# Patient Record
Sex: Female | Born: 1990 | Race: White | Hispanic: No | Smoking: Former smoker
Health system: Southern US, Community
[De-identification: ages and names within clinical notes are randomized; demographics above are authoritative.]

## PROBLEM LIST (undated history)

## (undated) ENCOUNTER — Inpatient Hospital Stay (HOSPITAL_COMMUNITY): Payer: Self-pay

## (undated) DIAGNOSIS — F419 Anxiety disorder, unspecified: Secondary | ICD-10-CM

## (undated) DIAGNOSIS — K219 Gastro-esophageal reflux disease without esophagitis: Secondary | ICD-10-CM

## (undated) DIAGNOSIS — K7689 Other specified diseases of liver: Secondary | ICD-10-CM

## (undated) DIAGNOSIS — Z789 Other specified health status: Secondary | ICD-10-CM

## (undated) DIAGNOSIS — E785 Hyperlipidemia, unspecified: Secondary | ICD-10-CM

## (undated) DIAGNOSIS — K829 Disease of gallbladder, unspecified: Secondary | ICD-10-CM

## (undated) DIAGNOSIS — G4700 Insomnia, unspecified: Secondary | ICD-10-CM

## (undated) DIAGNOSIS — E282 Polycystic ovarian syndrome: Secondary | ICD-10-CM

## (undated) DIAGNOSIS — G935 Compression of brain: Secondary | ICD-10-CM

## (undated) DIAGNOSIS — F410 Panic disorder [episodic paroxysmal anxiety] without agoraphobia: Secondary | ICD-10-CM

## (undated) HISTORY — DX: Compression of brain: G93.5

## (undated) HISTORY — DX: Insomnia, unspecified: G47.00

## (undated) HISTORY — DX: Anxiety disorder, unspecified: F41.9

## (undated) HISTORY — DX: Polycystic ovarian syndrome: E28.2

## (undated) HISTORY — DX: Other specified diseases of liver: K76.89

## (undated) HISTORY — DX: Hyperlipidemia, unspecified: E78.5

## (undated) HISTORY — DX: Gastro-esophageal reflux disease without esophagitis: K21.9

---

## 2012-04-19 ENCOUNTER — Emergency Department (HOSPITAL_COMMUNITY)
Admission: EM | Admit: 2012-04-19 | Discharge: 2012-04-19 | Disposition: A | Discharge status: Home / Self Care | Payer: Managed Care, Other (non HMO) | Attending: Emergency Medicine | Admitting: Emergency Medicine

## 2012-04-19 ENCOUNTER — Encounter (HOSPITAL_COMMUNITY): Payer: Self-pay | Admitting: Emergency Medicine

## 2012-04-19 ENCOUNTER — Emergency Department (HOSPITAL_COMMUNITY): Payer: Managed Care, Other (non HMO)

## 2012-04-19 DIAGNOSIS — R1011 Right upper quadrant pain: Secondary | ICD-10-CM | POA: Insufficient documentation

## 2012-04-19 DIAGNOSIS — R109 Unspecified abdominal pain: Secondary | ICD-10-CM

## 2012-04-19 DIAGNOSIS — Z87891 Personal history of nicotine dependence: Secondary | ICD-10-CM | POA: Insufficient documentation

## 2012-04-19 LAB — PREGNANCY, URINE: Preg Test, Ur: NEGATIVE

## 2012-04-19 LAB — URINALYSIS, ROUTINE W REFLEX MICROSCOPIC
Bilirubin Urine: NEGATIVE
Nitrite: NEGATIVE
Specific Gravity, Urine: 1.011 (ref 1.005–1.030)
Urobilinogen, UA: 0.2 mg/dL (ref 0.0–1.0)
pH: 5.5 (ref 5.0–8.0)

## 2012-04-19 LAB — COMPREHENSIVE METABOLIC PANEL
ALT: 11 U/L (ref 0–35)
Albumin: 3.6 g/dL (ref 3.5–5.2)
Alkaline Phosphatase: 26 U/L — ABNORMAL LOW (ref 39–117)
Chloride: 103 mEq/L (ref 96–112)
Glucose, Bld: 98 mg/dL (ref 70–99)
Potassium: 3.7 mEq/L (ref 3.5–5.1)
Sodium: 136 mEq/L (ref 135–145)
Total Bilirubin: 0.4 mg/dL (ref 0.3–1.2)
Total Protein: 6.7 g/dL (ref 6.0–8.3)

## 2012-04-19 LAB — CBC WITH DIFFERENTIAL/PLATELET
Basophils Relative: 0 % (ref 0–1)
Eosinophils Absolute: 0.2 10*3/uL (ref 0.0–0.7)
Hemoglobin: 13 g/dL (ref 12.0–15.0)
Lymphs Abs: 2.7 10*3/uL (ref 0.7–4.0)
MCH: 29.6 pg (ref 26.0–34.0)
Neutro Abs: 5.3 10*3/uL (ref 1.7–7.7)
Neutrophils Relative %: 60 % (ref 43–77)
Platelets: 303 10*3/uL (ref 150–400)
RBC: 4.39 MIL/uL (ref 3.87–5.11)

## 2012-04-19 MED ORDER — OXYCODONE-ACETAMINOPHEN 5-325 MG PO TABS: 1.0000 | ORAL_TABLET | ORAL | Status: AC | PRN

## 2012-04-19 MED ORDER — ONDANSETRON HCL 4 MG PO TABS: 4.0000 mg | ORAL_TABLET | Freq: Four times a day (QID) | ORAL | Status: AC

## 2012-04-19 MED ORDER — SODIUM CHLORIDE 0.9 % IV BOLUS (SEPSIS)
1000.0000 mL | Freq: Once | INTRAVENOUS | Status: AC
  Administered 2012-04-19: 1000 mL via INTRAVENOUS

## 2012-04-19 MED ORDER — ONDANSETRON HCL 4 MG/2ML IJ SOLN
4.0000 mg | Freq: Once | INTRAMUSCULAR | Status: AC
  Administered 2012-04-19: 4 mg via INTRAVENOUS
  Filled 2012-04-19: qty 2

## 2012-04-19 MED ORDER — HYDROMORPHONE HCL PF 1 MG/ML IJ SOLN
1.0000 mg | Freq: Once | INTRAMUSCULAR | Status: AC
  Administered 2012-04-19: 1 mg via INTRAVENOUS
  Filled 2012-04-19: qty 1

## 2012-04-19 MED ORDER — RANITIDINE HCL 150 MG PO TABS: 150.0000 mg | ORAL_TABLET | Freq: Two times a day (BID) | ORAL | Status: DC

## 2012-04-19 NOTE — ED Provider Notes (Signed)
History     CSN: [id number]  Arrival date & time 04/19/12  1756   First MD Initiated Contact with Patient 04/19/12 1843      Chief Complaint  Patient presents with  . Abdominal Pain    (Consider location/radiation/quality/duration/timing/severity/associated sxs/prior treatment) Patient is a 21 y.o. female presenting with abdominal pain. The history is provided by the patient and the spouse. No language interpreter was used.  Abdominal Pain The primary symptoms of the illness include abdominal pain, fever, nausea and vomiting. The primary symptoms of the illness do not include shortness of breath, diarrhea or vaginal discharge. The current episode started more than 2 days ago. The onset of the illness was gradual. The problem has been gradually worsening.  Associated with: eating. Symptoms associated with the illness do not include chills, heartburn, frequency or back pain.   21 year old female coming in with complaint of right upper quadrant abdominal pain nausea and vomiting. States that the pain started about a week ago and she has been vomiting yellow fluid. States that she has had a temp as well next was 101 yesterday. States that the pain and vomiting is worse after eating. No abdominal surgeries in the past. Denies vaginal discharge or bleeding. She does take birth control pills. Former smoker.   History reviewed. No pertinent past medical history.  Past Surgical History  Procedure Date  . Tonsillectomy     No family history on file.  History  Substance Use Topics  . Smoking status: Former Games developer  . Smokeless tobacco: Never Used  . Alcohol Use: 1.8 oz/week    3 Cans of beer per week    OB History    Grav Para Term Preterm Abortions TAB SAB Ect Mult Living                  Review of Systems  Constitutional: Positive for fever. Negative for chills.  HENT: Negative.   Eyes: Negative.   Respiratory: Negative.  Negative for shortness of breath.   Cardiovascular:  Negative.   Gastrointestinal: Positive for nausea, vomiting and abdominal pain. Negative for heartburn and diarrhea.  Genitourinary: Negative for frequency and vaginal discharge.  Musculoskeletal: Negative for back pain.  Neurological: Negative.   Psychiatric/Behavioral: Negative.   All other systems reviewed and are negative.    Allergies  Mold extract  Home Medications   Current Outpatient Rx  Name Route Sig Dispense Refill  . CETIRIZINE HCL 5 MG PO TABS Oral Take 5 mg by mouth daily.    Marland Kitchen ETHYNODIOL DIAC-ETH ESTRADIOL 1-50 MG-MCG PO TABS Oral Take 1 tablet by mouth daily.    Marland Kitchen ZOLPIDEM TARTRATE 5 MG PO TABS Oral Take 5 mg by mouth at bedtime as needed.      BP 127/75  Pulse 103  Temp 98.5 F (36.9 C) (Oral)  Resp 18  Ht 5\' 5"  (1.651 m)  Wt 200 lb (90.719 kg)  BMI 33.28 kg/m2  SpO2 100%  LMP 04/06/2012  Physical Exam  Nursing note and vitals reviewed. Constitutional: She is oriented to person, place, and time. She appears well-developed and well-nourished.  HENT:  Head: Normocephalic and atraumatic.  Eyes: Conjunctivae normal and EOM are normal. Pupils are equal, round, and reactive to light.  Neck: Normal range of motion. Neck supple.  Cardiovascular: Normal rate, regular rhythm, normal heart sounds and intact distal pulses.  Exam reveals no gallop and no friction rub.   No murmur heard. Pulmonary/Chest: Effort normal and breath sounds normal.  Abdominal: Soft.  Bowel sounds are normal.       RUQ tenderness  Musculoskeletal: Normal range of motion. She exhibits no edema and no tenderness.  Neurological: She is alert and oriented to person, place, and time. She has normal reflexes.  Skin: Skin is warm and dry.  Psychiatric: She has a normal mood and affect.    ED Course  Procedures (including critical care time)   Labs Reviewed  URINALYSIS, ROUTINE W REFLEX MICROSCOPIC  CBC WITH DIFFERENTIAL  COMPREHENSIVE METABOLIC PANEL  LIPASE, BLOOD   No results  found.   No diagnosis found.    MDM  21yo female with RUQ pain,subjective fever and vomiting x 1 week.  U/s negative for gallbladder dz.  Labs unremarkable. U/a normal.  GI follow up.  rx for zofran, percocet and zantac.  No further vomiting in the ER.  Pain 2/10 after pain meds x 1.  NS bolus also given.     Labs Reviewed  URINALYSIS, ROUTINE W REFLEX MICROSCOPIC - Abnormal; Notable for the following:    APPearance CLOUDY (*)     Leukocytes, UA SMALL (*)     All other components within normal limits  COMPREHENSIVE METABOLIC PANEL - Abnormal; Notable for the following:    Alkaline Phosphatase 26 (*)     All other components within normal limits  CBC WITH DIFFERENTIAL  LIPASE, BLOOD  URINE MICROSCOPIC-ADD ON  PREGNANCY, URINE  LAB REPORT - SCANNED           Remi Haggard, NP 04/21/12 1626

## 2012-04-19 NOTE — ED Notes (Signed)
Pt. on ultrasound at bedside.

## 2012-04-19 NOTE — ED Notes (Signed)
Pt indicates she has had abdominal pain, RUQ, fever, emesis for one week. Went to [facility] and was advised to come here for [place] for possible gallbladder disease. Pt was not given copies of any documents. Has been eating normally this week, escalates pain and emesis following most meals.

## 2012-04-23 NOTE — ED Provider Notes (Signed)
Medical screening examination/treatment/procedure(s) were performed by non-physician practitioner and as supervising physician I was immediately available for consultation/collaboration.   Rosie Golson, MD 04/23/12 1841 

## 2012-09-08 ENCOUNTER — Other Ambulatory Visit (HOSPITAL_COMMUNITY)
Admission: RE | Admit: 2012-09-08 | Discharge: 2012-09-08 | Disposition: A | Discharge status: Home / Self Care | Payer: BC Managed Care – PPO | Source: Ambulatory Visit | Attending: Obstetrics and Gynecology | Admitting: Obstetrics and Gynecology

## 2012-09-08 DIAGNOSIS — Z01419 Encounter for gynecological examination (general) (routine) without abnormal findings: Secondary | ICD-10-CM | POA: Insufficient documentation

## 2012-09-08 DIAGNOSIS — N760 Acute vaginitis: Secondary | ICD-10-CM | POA: Insufficient documentation

## 2012-09-10 ENCOUNTER — Other Ambulatory Visit: Payer: Self-pay

## 2012-09-10 DIAGNOSIS — IMO0002 Reserved for concepts with insufficient information to code with codable children: Secondary | ICD-10-CM

## 2012-09-13 ENCOUNTER — Ambulatory Visit
Admission: RE | Admit: 2012-09-13 | Discharge: 2012-09-13 | Disposition: A | Discharge status: Home / Self Care | Payer: BC Managed Care – PPO | Source: Ambulatory Visit

## 2012-09-13 DIAGNOSIS — IMO0002 Reserved for concepts with insufficient information to code with codable children: Secondary | ICD-10-CM

## 2013-02-11 ENCOUNTER — Emergency Department (HOSPITAL_COMMUNITY): Payer: BC Managed Care – PPO

## 2013-02-11 ENCOUNTER — Encounter (HOSPITAL_COMMUNITY): Payer: Self-pay | Admitting: *Deleted

## 2013-02-11 ENCOUNTER — Emergency Department (HOSPITAL_COMMUNITY)
Admission: EM | Admit: 2013-02-11 | Discharge: 2013-02-11 | Disposition: A | Discharge status: Home / Self Care | Payer: BC Managed Care – PPO | Attending: Emergency Medicine | Admitting: Emergency Medicine

## 2013-02-11 DIAGNOSIS — R197 Diarrhea, unspecified: Secondary | ICD-10-CM | POA: Insufficient documentation

## 2013-02-11 DIAGNOSIS — R630 Anorexia: Secondary | ICD-10-CM | POA: Insufficient documentation

## 2013-02-11 DIAGNOSIS — Z3202 Encounter for pregnancy test, result negative: Secondary | ICD-10-CM | POA: Insufficient documentation

## 2013-02-11 DIAGNOSIS — R110 Nausea: Secondary | ICD-10-CM | POA: Insufficient documentation

## 2013-02-11 DIAGNOSIS — K2970 Gastritis, unspecified, without bleeding: Secondary | ICD-10-CM | POA: Insufficient documentation

## 2013-02-11 DIAGNOSIS — Z79899 Other long term (current) drug therapy: Secondary | ICD-10-CM | POA: Insufficient documentation

## 2013-02-11 DIAGNOSIS — R109 Unspecified abdominal pain: Secondary | ICD-10-CM | POA: Insufficient documentation

## 2013-02-11 DIAGNOSIS — Z87891 Personal history of nicotine dependence: Secondary | ICD-10-CM | POA: Insufficient documentation

## 2013-02-11 LAB — URINALYSIS, ROUTINE W REFLEX MICROSCOPIC
Glucose, UA: NEGATIVE mg/dL
Ketones, ur: NEGATIVE mg/dL
Leukocytes, UA: NEGATIVE
Protein, ur: NEGATIVE mg/dL

## 2013-02-11 LAB — CBC WITH DIFFERENTIAL/PLATELET
Basophils Absolute: 0 10*3/uL (ref 0.0–0.1)
HCT: 37.6 % (ref 36.0–46.0)
Hemoglobin: 12.9 g/dL (ref 12.0–15.0)
Lymphocytes Relative: 24 % (ref 12–46)
Lymphs Abs: 1.8 10*3/uL (ref 0.7–4.0)
Monocytes Absolute: 0.6 10*3/uL (ref 0.1–1.0)
Monocytes Relative: 8 % (ref 3–12)
Neutro Abs: 4.7 10*3/uL (ref 1.7–7.7)
WBC: 7.2 10*3/uL (ref 4.0–10.5)

## 2013-02-11 LAB — COMPREHENSIVE METABOLIC PANEL
AST: 12 U/L (ref 0–37)
BUN: 10 mg/dL (ref 6–23)
CO2: 25 mEq/L (ref 19–32)
Chloride: 104 mEq/L (ref 96–112)
Creatinine, Ser: 0.61 mg/dL (ref 0.50–1.10)
GFR calc non Af Amer: >90 mL/min (ref >90)
Glucose, Bld: 91 mg/dL (ref 70–99)
Total Bilirubin: 0.6 mg/dL (ref 0.3–1.2)

## 2013-02-11 MED ORDER — FAMOTIDINE 20 MG PO TABS: 20.0000 mg | ORAL_TABLET | Freq: Two times a day (BID) | ORAL | Status: DC

## 2013-02-11 MED ORDER — HYDROCODONE-ACETAMINOPHEN 5-325 MG PO TABS: 1.0000 | ORAL_TABLET | ORAL | Status: DC | PRN

## 2013-02-11 MED ORDER — HYDROMORPHONE HCL PF 1 MG/ML IJ SOLN
1.0000 mg | Freq: Once | INTRAMUSCULAR | Status: DC
  Filled 2013-02-11: qty 1

## 2013-02-11 MED ORDER — IOHEXOL 300 MG/ML  SOLN
100.0000 mL | Freq: Once | INTRAMUSCULAR | Status: AC | PRN
  Administered 2013-02-11: 100 mL via INTRAVENOUS

## 2013-02-11 MED ORDER — ONDANSETRON 8 MG PO TBDP
8.0000 mg | ORAL_TABLET | Freq: Once | ORAL | Status: AC
  Administered 2013-02-11: 8 mg via ORAL
  Filled 2013-02-11: qty 1

## 2013-02-11 MED ORDER — IOHEXOL 300 MG/ML  SOLN
50.0000 mL | Freq: Once | INTRAMUSCULAR | Status: AC | PRN
  Administered 2013-02-11: 50 mL via ORAL

## 2013-02-11 MED ORDER — ONDANSETRON HCL 4 MG/2ML IJ SOLN
4.0000 mg | Freq: Once | INTRAMUSCULAR | Status: AC
  Administered 2013-02-11: 4 mg via INTRAVENOUS
  Filled 2013-02-11: qty 2

## 2013-02-11 MED ORDER — HYDROMORPHONE HCL PF 1 MG/ML IJ SOLN
1.0000 mg | Freq: Once | INTRAMUSCULAR | Status: AC
  Administered 2013-02-11: 1 mg via INTRAVENOUS
  Filled 2013-02-11: qty 1

## 2013-02-11 MED ORDER — PANTOPRAZOLE SODIUM 40 MG IV SOLR
40.0000 mg | Freq: Once | INTRAVENOUS | Status: AC
  Administered 2013-02-11: 40 mg via INTRAVENOUS
  Filled 2013-02-11: qty 40

## 2013-02-11 MED ORDER — HYDROMORPHONE HCL PF 1 MG/ML IJ SOLN
1.0000 mg | Freq: Once | INTRAMUSCULAR | Status: AC
  Administered 2013-02-11: 1 mg via INTRAVENOUS
  Filled 2013-02-11 (×2): qty 1

## 2013-02-11 MED ORDER — HYDROMORPHONE HCL PF 1 MG/ML IJ SOLN
1.0000 mg | Freq: Once | INTRAMUSCULAR | Status: AC
  Administered 2013-02-11: 1 mg via INTRAMUSCULAR

## 2013-02-11 NOTE — ED Notes (Signed)
Patient was educated not to drive, operate heavy machinery, or drink alcohol while taking narcotic medication.  

## 2013-02-11 NOTE — Progress Notes (Signed)
Patient reports Dr. Farris Has of [facility] is his pcp.

## 2013-02-11 NOTE — ED Provider Notes (Signed)
History    CSN: [id number] Arrival date & time 02/11/13  1154  First MD Initiated Contact with Patient 02/11/13 1301     Chief Complaint  Patient presents with  . Abdominal Pain   (Consider location/radiation/quality/duration/timing/severity/associated sxs/prior Treatment) HPI Comments: 22 year old female with no significant past medical history presents emergency department complaining of sudden onset midepigastric and left upper quadrant abdominal pain beginning last night after eating 2 hamburgers from [employer]. States the pain is constant with episodes of severe intensity, nonradiating. Admits to associated nausea without vomiting. She has not had any appetite today and did not eat anything today. She has not tried any alleviating factors for her pain. No history of abdominal surgery. Admits to associated non-bloody diarrhea. Denies fever, chills, vaginal discharge or bleeding or urinary changes.  Patient is a 22 y.o. female presenting with abdominal pain. The history is provided by the patient.  Abdominal Pain Associated symptoms include abdominal pain and nausea. Pertinent negatives include no chest pain, chills, fever or vomiting.   History reviewed. No pertinent past medical history. Past Surgical History  Procedure Laterality Date  . Tonsillectomy     History reviewed. No pertinent family history. History  Substance Use Topics  . Smoking status: Former Games developer  . Smokeless tobacco: Never Used  . Alcohol Use: 1.8 oz/week    3 Cans of beer per week   OB History   Grav Para Term Preterm Abortions TAB SAB Ect Mult Living                 Review of Systems  Constitutional: Positive for appetite change. Negative for fever and chills.  Respiratory: Negative for shortness of breath.   Cardiovascular: Negative for chest pain.  Gastrointestinal: Positive for nausea, abdominal pain and diarrhea. Negative for vomiting and constipation.  Genitourinary: Negative for flank pain,  vaginal bleeding, vaginal discharge, vaginal pain, menstrual problem and pelvic pain.  All other systems reviewed and are negative.    Allergies  Erythromycin; Oxycontin; Vicodin; and Mold extract  Home Medications   Current Outpatient Rx  Name  Route  Sig  Dispense  Refill  . cetirizine (ZYRTEC) 5 MG tablet   Oral   Take 5 mg by mouth daily.         . Norethindrone-Ethinyl Estradiol-Fe Biphas (LO LOESTRIN FE) 1 MG-10 MCG / 10 MCG tablet   Oral   Take 1 tablet by mouth daily.         Marland Kitchen zolpidem (AMBIEN) 5 MG tablet   Oral   Take 10 mg by mouth at bedtime as needed.           BP 148/85  Pulse 115  Temp(Src) 98.6 F (37 C)  Resp 18  SpO2 100%  LMP 01/26/2013 Physical Exam  Nursing note and vitals reviewed. Constitutional: She is oriented to person, place, and time. She appears well-developed. No distress.  Obese, tearful.  HENT:  Head: Normocephalic and atraumatic.  Mouth/Throat: Oropharynx is clear and moist.  Eyes: Conjunctivae are normal. No scleral icterus.  Neck: Normal range of motion. Neck supple.  Cardiovascular: Regular rhythm and normal heart sounds.  Tachycardia present.   Pulmonary/Chest: Effort normal and breath sounds normal. No respiratory distress.  Abdominal: Soft. Normal appearance. She exhibits no distension and no mass. There is tenderness in the epigastric area and left upper quadrant. There is no rigidity, no rebound and no guarding.  Musculoskeletal: Normal range of motion. She exhibits no edema.  Neurological: She is alert and oriented  to person, place, and time.  Skin: Skin is warm and dry. She is not diaphoretic.  Psychiatric: She has a normal mood and affect. Her behavior is normal.    ED Course  Procedures (including critical care time) Labs Reviewed  COMPREHENSIVE METABOLIC PANEL - Abnormal; Notable for the following:    Alkaline Phosphatase 34 (*)    All other components within normal limits  URINALYSIS, ROUTINE W REFLEX  MICROSCOPIC - Abnormal; Notable for the following:    APPearance CLOUDY (*)    All other components within normal limits  CBC WITH DIFFERENTIAL  LIPASE, BLOOD  POCT PREGNANCY, URINE   Ct Abdomen Pelvis W Contrast  02/11/2013   *RADIOLOGY REPORT*  Clinical Data: Left upper quadrant abdominal pain  CT ABDOMEN AND PELVIS WITH CONTRAST  Technique:  Multidetector CT imaging of the abdomen and pelvis was performed following the standard protocol during bolus administration of intravenous contrast.  Contrast: OMNIPAQUE IOHEXOL 300 MG/ML  SOLN  Comparison: Abdominal ultrasound 04/19/2012  Findings:  Lower Chest:  The lung bases are clear.  The visualized cardiac structures are within normal limits for size.  Trace pericardial fluid versus thickening.  Unremarkable distal thoracic esophagus.  Abdomen: Unremarkable CT appearance of the stomach, duodenum, spleen, adrenal glands and pancreas.  Normal hepatic contours and morphology.  No focal hepatic lesion. Gallbladder is unremarkable. No intra or extrahepatic biliary ductal dilatation.  Normal parenchymal enhancement of the kidneys bilaterally.  No hydronephrosis, nephrolithiasis or enhancing renal mass.  Normal-caliber large and small bowel throughout the abdomen.  No evidence of obstruction.  Normal appendix in the right lower quadrant.  Unremarkable terminal ileum.  No free fluid or suspicious adenopathy.  Pelvis: Unremarkable bladder, uterus and adnexa.  Trace free fluid in the pelvis is likely physiologic for a reproductive age female.  Bones: No acute fracture or aggressive appearing lytic or blastic osseous lesion.  Vascular: No acute vascular abnormality.  IMPRESSION: Essentially unremarkable CT scan of the abdomen and pelvis.  No acute abnormality to explain the patient's clinical symptoms.  Trace free fluid in the pelvis is likely physiologic in a reproductive age female.   Original Report Authenticated By: Malachy Moan, M.D.   1. Gastritis   2.  Abdominal pain     MDM  Patient with mid-epigastric/LUQ abdominal pain beginning after eating [employer] last night. Nausea, no vomiting. Probable gastritis. Labs obtained in triage prior to patient being seen- cbc, cmp, lipase, ua, preg. Dilaudid for pain, zofran for nausea, protonix. 2:41 PM Patient reports worsening abdominal pain. Left sided, radiating to back. No nausea. Epigastric/LUQ very tender on exam. Will give another dose of dilaudid, obtain CT scan abdomen/pelvis. 3:54 PM CT scan unremarkable. Patient reports marked improvement of pain at this time. Abdomen soft with mild tenderness. Her vitals are stable and she is stable for discharge. Discharged with pepcid and vicodin. She will f/u with her PCP. Conservative measures discussed. Return precautions discussed. Patient states understanding of plan and is agreeable.   Trevor Mace, PA-C 02/11/13 1555

## 2013-02-11 NOTE — ED Notes (Signed)
Pt c/o severe abd pain to left upper quadrant that started suddenly last night. Denies vomiting reports nausea.

## 2013-02-12 NOTE — ED Provider Notes (Signed)
Medical screening examination/treatment/procedure(s) were performed by non-physician practitioner and as supervising physician I was immediately available for consultation/collaboration.  Derwood Kaplan, MD 02/12/13 [phone]

## 2013-06-05 LAB — OB RESULTS CONSOLE ABO/RH: RH TYPE: POSITIVE

## 2013-06-05 LAB — OB RESULTS CONSOLE HIV ANTIBODY (ROUTINE TESTING): HIV: NONREACTIVE

## 2013-06-25 LAB — OB RESULTS CONSOLE GC/CHLAMYDIA
CHLAMYDIA, DNA PROBE: NEGATIVE
Gonorrhea: NEGATIVE

## 2013-08-06 HISTORY — PX: TONSILLECTOMY: SUR1361

## 2013-08-06 NOTE — L&D Delivery Note (Signed)
Delivery Note  Pt began pushing about 1900, FHR remained reassuring   At 7:32 PM a viable female was delivered via Vaginal, Spontaneous Delivery (Presentation: Right Occiput Anterior).  APGAR: 8,8 ; weight: pending    Placenta status: Intact, Spontaneous Pathology.  Cord: 3 vessels with the following complications: .  Cord pH: n/a   Anesthesia: Epidural  Episiotomy: None Lacerations: 2nd degree;Periurethral Suture Repair: 3.0 vicryl 4-0 monocryl Est. Blood Loss (mL): 350  Mom to postpartum.  Baby to Couplet care / Skin to Skin. Pt plans to BF Pt desires inpatient circumcision  Pt plans OCP for contraception   LILLARD,SHELLEY M 01/30/2014, 8:03 PM

## 2013-11-23 ENCOUNTER — Other Ambulatory Visit: Payer: Self-pay

## 2013-11-26 ENCOUNTER — Other Ambulatory Visit: Payer: Self-pay | Admitting: Obstetrics and Gynecology

## 2013-11-27 ENCOUNTER — Ambulatory Visit (HOSPITAL_COMMUNITY): Admission: RE | Admit: 2013-11-27 | Payer: BC Managed Care – PPO | Source: Ambulatory Visit

## 2013-11-27 ENCOUNTER — Ambulatory Visit (HOSPITAL_COMMUNITY)
Admission: RE | Admit: 2013-11-27 | Discharge: 2013-11-27 | Disposition: A | Discharge status: Home / Self Care | Payer: BC Managed Care – PPO | Source: Ambulatory Visit | Attending: Obstetrics and Gynecology | Admitting: Obstetrics and Gynecology

## 2013-11-27 ENCOUNTER — Encounter (HOSPITAL_COMMUNITY): Payer: Self-pay

## 2013-11-27 ENCOUNTER — Other Ambulatory Visit: Payer: Self-pay | Admitting: Obstetrics and Gynecology

## 2013-11-27 DIAGNOSIS — Z3689 Encounter for other specified antenatal screening: Secondary | ICD-10-CM | POA: Insufficient documentation

## 2013-11-27 DIAGNOSIS — O365990 Maternal care for other known or suspected poor fetal growth, unspecified trimester, not applicable or unspecified: Secondary | ICD-10-CM | POA: Insufficient documentation

## 2013-12-01 ENCOUNTER — Other Ambulatory Visit: Payer: Self-pay | Admitting: Obstetrics and Gynecology

## 2013-12-01 DIAGNOSIS — O365990 Maternal care for other known or suspected poor fetal growth, unspecified trimester, not applicable or unspecified: Secondary | ICD-10-CM

## 2013-12-02 ENCOUNTER — Inpatient Hospital Stay (HOSPITAL_COMMUNITY)
Admission: AD | Admit: 2013-12-02 | Discharge: 2013-12-03 | Disposition: A | Discharge status: Home / Self Care | Payer: BC Managed Care – PPO | Source: Ambulatory Visit | Attending: Obstetrics and Gynecology | Admitting: Obstetrics and Gynecology

## 2013-12-02 ENCOUNTER — Encounter (HOSPITAL_COMMUNITY): Payer: Self-pay | Admitting: *Deleted

## 2013-12-02 DIAGNOSIS — Z87891 Personal history of nicotine dependence: Secondary | ICD-10-CM | POA: Insufficient documentation

## 2013-12-02 DIAGNOSIS — Z8249 Family history of ischemic heart disease and other diseases of the circulatory system: Secondary | ICD-10-CM | POA: Insufficient documentation

## 2013-12-02 DIAGNOSIS — O365990 Maternal care for other known or suspected poor fetal growth, unspecified trimester, not applicable or unspecified: Secondary | ICD-10-CM | POA: Insufficient documentation

## 2013-12-02 DIAGNOSIS — O368190 Decreased fetal movements, unspecified trimester, not applicable or unspecified: Secondary | ICD-10-CM | POA: Insufficient documentation

## 2013-12-02 DIAGNOSIS — O368130 Decreased fetal movements, third trimester, not applicable or unspecified: Secondary | ICD-10-CM

## 2013-12-02 NOTE — MAU Note (Signed)
Pt states she last "felt" the baby move about 2100.

## 2013-12-02 NOTE — MAU Provider Note (Signed)
Chief Complaint:  Decreased Fetal Movement  First Provider Initiated Contact with Patient 12/02/13 2354    HPI: Cheryl [name] is a 23 y.o. G1P0000 at 5358w5d who presents to maternity admissions reporting decreased FM tonight.  Denies contractions, leakage of fluid or vaginal bleeding. Good fetal movement.   Pregnancy Course: IUGR  Past Medical History: History reviewed. No pertinent past medical history.  Past obstetric history: OB History  Gravida Para Term Preterm AB SAB TAB Ectopic Multiple Living  1 0 0 0 0 0 0 0 0 0     # Outcome Date GA Lbr Len/2nd Weight Sex Delivery Anes PTL Lv  1 CUR               Past Surgical History: Past Surgical History  Procedure Laterality Date  . Tonsillectomy       Family History: Family History  Problem Relation Age of Onset  . Hypertension Mother   . Depression Mother   . Parkinson's disease Mother   . Diabetes Father   . Hypertension Father   . Diabetes Maternal Grandmother   . Cancer Maternal Grandmother   . Hyperlipidemia Maternal Grandmother   . Hyperlipidemia Paternal Grandmother   . Diabetes Paternal Grandmother   . Parkinson's disease Paternal Grandmother   . Diabetes Paternal Grandfather   . Hyperlipidemia Paternal Grandfather     Social History: History  Substance Use Topics  . Smoking status: Former Games developermoker  . Smokeless tobacco: Never Used  . Alcohol Use: None    Allergies:  Allergies  Allergen Reactions  . Erythromycin Itching  . Oxycontin [Oxycodone Hcl] Itching  . Vicodin [Hydrocodone-Acetaminophen] Itching  . Mold Extract [Trichophyton] Rash    Meds:  Prescriptions prior to admission  Medication Sig Dispense Refill  . cetirizine (ZYRTEC) 5 MG tablet Take 5 mg by mouth daily.      . famotidine (PEPCID) 20 MG tablet Take 1 tablet (20 mg total) by mouth 2 (two) times daily.  30 tablet  0  . HYDROcodone-acetaminophen (NORCO/VICODIN) 5-325 MG per tablet Take 1-2 tablets by mouth every 4 (four) hours as  needed for pain.  10 tablet  0  . Norethindrone-Ethinyl Estradiol-Fe Biphas (LO LOESTRIN FE) 1 MG-10 MCG / 10 MCG tablet Take 1 tablet by mouth daily.      Marland Kitchen. zolpidem (AMBIEN) 5 MG tablet Take 10 mg by mouth at bedtime as needed.         ROS: Pertinent findings in history of present illness.  Physical Exam  Blood pressure 122/68, pulse 100, temperature 98.3 F (36.8 C), temperature source Oral, resp. rate 18, height 5\' 4"  (1.626 m), weight 101.606 kg (224 lb), last menstrual period 05/01/2013, SpO2 100.00%. GENERAL: Well-developed, well-nourished female in no acute distress.  HEENT: normocephalic HEART: normal rate RESP: normal effort ABDOMEN: Soft, non-tender, gravid appropriate for gestational age EXTREMITIES: Nontender, no edema NEURO: alert and oriented  FHT:  Baseline 140 , moderate variability, accelerations present, no decelerations Contractions: None   Labs: No results found for this or any previous visit (from the past 24 hour(s)).  Imaging:  NA  MAU Course: Feeling baby move well while in MAU.   Assessment: 1. Decreased fetal movements in third trimester    Plan: Discharge home Preterm labor precautions and fetal kick counts.  Follow-up Information   Follow up with Geryl RankinsVARNADO, [name], MD On 12/08/2013. (As scheduled or sooner, As needed, If symptoms worsen)    Specialty:  Obstetrics and Gynecology   Contact information:   [phone]  [address] [place] [address] [phone]       [facility] with [facility]. (As needed in emergencies)    Contact information:   [id number] [place] [address] [phone]       Medication List    STOP taking these medications       famotidine 20 MG tablet  Commonly known as:  PEPCID     HYDROcodone-acetaminophen 5-325 MG per tablet  Commonly known as:  NORCO/VICODIN     LO LOESTRIN FE 1 MG-10 MCG / 10 MCG tablet  Generic drug:   Norethindrone-Ethinyl Estradiol-Fe Biphas     zolpidem 5 MG tablet  Commonly known as:  AMBIEN      TAKE these medications       cetirizine 5 MG tablet  Commonly known as:  ZYRTEC  Take 5 mg by mouth daily.       [place] Sophi Calligan, CNM 12/02/2013 11:55 PM

## 2013-12-02 NOTE — MAU Note (Signed)
Decreased fetal movement this pm

## 2013-12-03 DIAGNOSIS — O368190 Decreased fetal movements, unspecified trimester, not applicable or unspecified: Secondary | ICD-10-CM

## 2013-12-03 NOTE — Discharge Instructions (Signed)

## 2013-12-04 ENCOUNTER — Other Ambulatory Visit: Payer: Self-pay

## 2013-12-04 ENCOUNTER — Ambulatory Visit (HOSPITAL_COMMUNITY)
Admission: RE | Admit: 2013-12-04 | Discharge: 2013-12-04 | Disposition: A | Discharge status: Home / Self Care | Payer: BC Managed Care – PPO | Source: Ambulatory Visit | Attending: Obstetrics and Gynecology | Admitting: Obstetrics and Gynecology

## 2013-12-04 ENCOUNTER — Encounter (HOSPITAL_COMMUNITY): Payer: Self-pay | Admitting: *Deleted

## 2013-12-04 ENCOUNTER — Inpatient Hospital Stay (HOSPITAL_COMMUNITY)
Admission: AD | Admit: 2013-12-04 | Discharge: 2013-12-04 | Disposition: A | Discharge status: Home / Self Care | Payer: BC Managed Care – PPO | Source: Ambulatory Visit | Attending: Obstetrics and Gynecology | Admitting: Obstetrics and Gynecology

## 2013-12-04 DIAGNOSIS — O99891 Other specified diseases and conditions complicating pregnancy: Secondary | ICD-10-CM

## 2013-12-04 DIAGNOSIS — Z3689 Encounter for other specified antenatal screening: Secondary | ICD-10-CM | POA: Insufficient documentation

## 2013-12-04 DIAGNOSIS — R000 Tachycardia, unspecified: Secondary | ICD-10-CM

## 2013-12-04 DIAGNOSIS — O9989 Other specified diseases and conditions complicating pregnancy, childbirth and the puerperium: Principal | ICD-10-CM

## 2013-12-04 DIAGNOSIS — O365990 Maternal care for other known or suspected poor fetal growth, unspecified trimester, not applicable or unspecified: Secondary | ICD-10-CM | POA: Insufficient documentation

## 2013-12-04 DIAGNOSIS — Z8249 Family history of ischemic heart disease and other diseases of the circulatory system: Secondary | ICD-10-CM | POA: Insufficient documentation

## 2013-12-04 DIAGNOSIS — Z87891 Personal history of nicotine dependence: Secondary | ICD-10-CM | POA: Insufficient documentation

## 2013-12-04 HISTORY — DX: Other specified health status: Z78.9

## 2013-12-04 LAB — CBC
HEMATOCRIT: 36.4 % (ref 36.0–46.0)
HEMOGLOBIN: 12.3 g/dL (ref 12.0–15.0)
MCH: 29.8 pg (ref 26.0–34.0)
MCHC: 33.8 g/dL (ref 30.0–36.0)
MCV: 88.1 fL (ref 78.0–100.0)
Platelets: 183 10*3/uL (ref 150–400)
RBC: 4.13 MIL/uL (ref 3.87–5.11)
RDW: 13.7 % (ref 11.5–15.5)
WBC: 11.8 10*3/uL — ABNORMAL HIGH (ref 4.0–10.5)

## 2013-12-04 LAB — URINALYSIS, ROUTINE W REFLEX MICROSCOPIC
Bilirubin Urine: NEGATIVE
GLUCOSE, UA: NEGATIVE mg/dL
HGB URINE DIPSTICK: NEGATIVE
KETONES UR: NEGATIVE mg/dL
Leukocytes, UA: NEGATIVE
Nitrite: NEGATIVE
PROTEIN: NEGATIVE mg/dL
Specific Gravity, Urine: 1.01 (ref 1.005–1.030)
UROBILINOGEN UA: 0.2 mg/dL (ref 0.0–1.0)
pH: 7 (ref 5.0–8.0)

## 2013-12-04 LAB — COMPREHENSIVE METABOLIC PANEL
ALK PHOS: 59 U/L (ref 39–117)
ALT: 11 U/L (ref 0–35)
AST: 11 U/L (ref 0–37)
Albumin: 2.9 g/dL — ABNORMAL LOW (ref 3.5–5.2)
BUN: 7 mg/dL (ref 6–23)
CALCIUM: 8.7 mg/dL (ref 8.4–10.5)
CO2: 22 meq/L (ref 19–32)
Chloride: 103 mEq/L (ref 96–112)
Creatinine, Ser: 0.47 mg/dL — ABNORMAL LOW (ref 0.50–1.10)
GLUCOSE: 85 mg/dL (ref 70–99)
Potassium: 4.1 mEq/L (ref 3.7–5.3)
Sodium: 138 mEq/L (ref 137–147)
TOTAL PROTEIN: 6.4 g/dL (ref 6.0–8.3)
Total Bilirubin: 0.2 mg/dL — ABNORMAL LOW (ref 0.3–1.2)

## 2013-12-04 LAB — TSH: TSH: 1.58 u[IU]/mL (ref 0.350–4.500)

## 2013-12-04 LAB — T4, FREE: Free T4: 1.1 ng/dL (ref 0.80–1.80)

## 2013-12-04 NOTE — MAU Provider Note (Signed)
History     CSN: [id number]  Arrival date and time: 12/04/13 1123   First Provider Initiated Contact with Patient 12/04/13 1243      No chief complaint on file.  HPI  Cheryl [name] is a 23 y.o. G1P0000 at 8355w0d who presents today from MFM with increased heartrate. She has been followed by MFM for decreased fetal growth, and was there for doppler studies today. While there it was noted that she was tachycardic. She states that her grandmother passed away this morning, and she has been upset about that. However, she states that she does not feel anxious. She states that she has not had any episodes similar to this in the past, and denies any cardiac HX or medications.   Past Medical History  Diagnosis Date  . Medical history non-contributory     Past Surgical History  Procedure Laterality Date  . Tonsillectomy      Family History  Problem Relation Age of Onset  . Hypertension Mother   . Depression Mother   . Parkinson's disease Mother   . Diabetes Father   . Hypertension Father   . Diabetes Maternal Grandmother   . Cancer Maternal Grandmother   . Hyperlipidemia Maternal Grandmother   . Hyperlipidemia Paternal Grandmother   . Diabetes Paternal Grandmother   . Parkinson's disease Paternal Grandmother   . Diabetes Paternal Grandfather   . Hyperlipidemia Paternal Grandfather     History  Substance Use Topics  . Smoking status: Former Games developermoker  . Smokeless tobacco: Never Used  . Alcohol Use: No    Allergies:  Allergies  Allergen Reactions  . Erythromycin Itching  . Oxycontin [Oxycodone Hcl] Itching  . Vicodin [Hydrocodone-Acetaminophen] Itching  . Mold Extract [Trichophyton] Rash    Prescriptions prior to admission  Medication Sig Dispense Refill  . acetaminophen (TYLENOL) 325 MG tablet Take 325 mg by mouth every 6 (six) hours as needed for headache.      . calcium carbonate (TUMS - DOSED IN MG ELEMENTAL CALCIUM) 500 MG chewable tablet Chew 2 tablets by mouth  daily as needed for indigestion or heartburn.      . cetirizine (ZYRTEC) 5 MG tablet Take 5 mg by mouth daily.      . Doxylamine-Pyridoxine 10-10 MG TBEC Take 2 tablets by mouth at bedtime.        ROS Physical Exam   Blood pressure 123/70, pulse 128, temperature 97.7 F (36.5 C), temperature source Oral, resp. rate 18, last menstrual period 05/01/2013, SpO2 98.00%.  Physical Exam  Nursing note and vitals reviewed. Constitutional: She is oriented to person, place, and time. She appears well-developed and well-nourished. No distress.  Cardiovascular: Normal rate.   Respiratory: Effort normal.  GI: Soft.  Neurological: She is alert and oriented to person, place, and time.  Skin: Skin is warm and dry.  Psychiatric: She has a normal mood and affect.   FHT: 150, moderate with 15x15 accels, no decels Toco: No UCs  MAU Course  Procedures  Results for orders placed during the hospital encounter of 12/04/13 (from the past 24 hour(s))  CBC     Status: Abnormal   Collection Time    12/04/13 12:18 PM      Result Value Ref Range   WBC 11.8 (*) 4.0 - 10.5 K/uL   RBC 4.13  3.87 - 5.11 MIL/uL   Hemoglobin 12.3  12.0 - 15.0 g/dL   HCT 40.936.4  81.136.0 - 91.446.0 %   MCV 88.1  78.0 - 100.0  fL   MCH 29.8  26.0 - 34.0 pg   MCHC 33.8  30.0 - 36.0 g/dL   RDW 16.113.7  09.611.5 - 04.515.5 %   Platelets 183  150 - 400 K/uL  COMPREHENSIVE METABOLIC PANEL     Status: Abnormal   Collection Time    12/04/13 12:18 PM      Result Value Ref Range   Sodium 138  137 - 147 mEq/L   Potassium 4.1  3.7 - 5.3 mEq/L   Chloride 103  96 - 112 mEq/L   CO2 22  19 - 32 mEq/L   Glucose, Bld 85  70 - 99 mg/dL   BUN 7  6 - 23 mg/dL   Creatinine, Ser 4.090.47 (*) 0.50 - 1.10 mg/dL   Calcium 8.7  8.4 - 81.110.5 mg/dL   Total Protein 6.4  6.0 - 8.3 g/dL   Albumin 2.9 (*) 3.5 - 5.2 g/dL   AST 11  0 - 37 U/L   ALT 11  0 - 35 U/L   Alkaline Phosphatase 59  39 - 117 U/L   Total Bilirubin 0.2 (*) 0.3 - 1.2 mg/dL   GFR calc non Af Amer >90  >90  mL/min   GFR calc Af Amer >90  >90 mL/min  URINALYSIS, ROUTINE W REFLEX MICROSCOPIC     Status: None   Collection Time    12/04/13  1:15 PM      Result Value Ref Range   Color, Urine YELLOW  YELLOW   APPearance CLEAR  CLEAR   Specific Gravity, Urine 1.010  1.005 - 1.030   pH 7.0  5.0 - 8.0   Glucose, UA NEGATIVE  NEGATIVE mg/dL   Hgb urine dipstick NEGATIVE  NEGATIVE   Bilirubin Urine NEGATIVE  NEGATIVE   Ketones, ur NEGATIVE  NEGATIVE mg/dL   Protein, ur NEGATIVE  NEGATIVE mg/dL   Urobilinogen, UA 0.2  0.0 - 1.0 mg/dL   Nitrite NEGATIVE  NEGATIVE   Leukocytes, UA NEGATIVE  NEGATIVE     1320: Dr. Dion BodyVarnado on the unit to see the patient. Reviewed labs/pending labs. She would like a UA done. If dehydrated will give LR bolus. If not, encourage increased PO hydration.   Assessment and Plan   1. Tachycardia    Third trimester precautions reviewed Fetal kick counts Return to MAU as needed  Follow-up Information   Follow up with Geryl RankinsVARNADO, [name], MD. (As scheduled)    Specialty:  Obstetrics and Gynecology   Contact information:   [address], [address] [place] [address] [phone]        Tawnya CrookHeather Donovan Hogan 12/04/2013, 12:56 PM

## 2013-12-04 NOTE — Discharge Instructions (Signed)
Third Trimester of Pregnancy  The third trimester is from week 29 through week 42, months 7 through 9. The third trimester is a time when the fetus is growing rapidly. At the end of the ninth month, the fetus is about 20 inches in length and weighs 6 10 pounds.   BODY CHANGES  Your body goes through many changes during pregnancy. The changes vary from woman to woman.    Your weight will continue to increase. You can expect to gain 25 35 pounds (11 16 kg) by the end of the pregnancy.   You may begin to get stretch marks on your hips, abdomen, and breasts.   You may urinate more often because the fetus is moving lower into your pelvis and pressing on your bladder.   You may develop or continue to have heartburn as a result of your pregnancy.   You may develop constipation because certain hormones are causing the muscles that push waste through your intestines to slow down.   You may develop hemorrhoids or swollen, bulging veins (varicose veins).   You may have pelvic pain because of the weight gain and pregnancy hormones relaxing your joints between the bones in your pelvis. Back aches may result from over exertion of the muscles supporting your posture.   Your breasts will continue to grow and be tender. A yellow discharge may leak from your breasts called colostrum.   Your belly button may stick out.   You may feel short of breath because of your expanding uterus.   You may notice the fetus "dropping," or moving lower in your abdomen.   You may have a bloody mucus discharge. This usually occurs a few days to a week before labor begins.   Your cervix becomes thin and soft (effaced) near your due date.  WHAT TO EXPECT AT YOUR PRENATAL EXAMS   You will have prenatal exams every 2 weeks until week 36. Then, you will have weekly prenatal exams. During a routine prenatal visit:   You will be weighed to make sure you and the fetus are growing normally.   Your blood pressure is taken.   Your abdomen will be  measured to track your baby's growth.   The fetal heartbeat will be listened to.   Any test results from the previous visit will be discussed.   You may have a cervical check near your due date to see if you have effaced.  At around 36 weeks, your caregiver will check your cervix. At the same time, your caregiver will also perform a test on the secretions of the vaginal tissue. This test is to determine if a type of bacteria, Group B streptococcus, is present. Your caregiver will explain this further.  Your caregiver may ask you:   What your birth plan is.   How you are feeling.   If you are feeling the baby move.   If you have had any abnormal symptoms, such as leaking fluid, bleeding, severe headaches, or abdominal cramping.   If you have any questions.  Other tests or screenings that may be performed during your third trimester include:   Blood tests that check for low iron levels (anemia).   Fetal testing to check the health, activity level, and growth of the fetus. Testing is done if you have certain medical conditions or if there are problems during the pregnancy.  FALSE LABOR  You may feel small, irregular contractions that eventually go away. These are called Braxton Hicks contractions, or   false labor. Contractions may last for hours, days, or even weeks before true labor sets in. If contractions come at regular intervals, intensify, or become painful, it is best to be seen by your caregiver.   SIGNS OF LABOR    Menstrual-like cramps.   Contractions that are 5 minutes apart or less.   Contractions that start on the top of the uterus and spread down to the lower abdomen and back.   A sense of increased pelvic pressure or back pain.   A watery or bloody mucus discharge that comes from the vagina.  If you have any of these signs before the 37th week of pregnancy, call your caregiver right away. You need to go to the hospital to get checked immediately.  HOME CARE INSTRUCTIONS    Avoid all  smoking, herbs, alcohol, and unprescribed drugs. These chemicals affect the formation and growth of the baby.   Follow your caregiver's instructions regarding medicine use. There are medicines that are either safe or unsafe to take during pregnancy.   Exercise only as directed by your caregiver. Experiencing uterine cramps is a good sign to stop exercising.   Continue to eat regular, healthy meals.   Wear a good support bra for breast tenderness.   Do not use hot tubs, steam rooms, or saunas.   Wear your seat belt at all times when driving.   Avoid raw meat, uncooked cheese, cat litter boxes, and soil used by cats. These carry germs that can cause birth defects in the baby.   Take your prenatal vitamins.   Try taking a stool softener (if your caregiver approves) if you develop constipation. Eat more high-fiber foods, such as fresh vegetables or fruit and whole grains. Drink plenty of fluids to keep your urine clear or pale yellow.   Take warm sitz baths to soothe any pain or discomfort caused by hemorrhoids. Use hemorrhoid cream if your caregiver approves.   If you develop varicose veins, wear support hose. Elevate your feet for 15 minutes, 3 4 times a day. Limit salt in your diet.   Avoid heavy lifting, wear low heal shoes, and practice good posture.   Rest a lot with your legs elevated if you have leg cramps or low back pain.   Visit your dentist if you have not gone during your pregnancy. Use a soft toothbrush to brush your teeth and be gentle when you floss.   A sexual relationship may be continued unless your caregiver directs you otherwise.   Do not travel far distances unless it is absolutely necessary and only with the approval of your caregiver.   Take prenatal classes to understand, practice, and ask questions about the labor and delivery.   Make a trial run to the hospital.   Pack your hospital bag.   Prepare the baby's nursery.   Continue to go to all your prenatal visits as directed  by your caregiver.  SEEK MEDICAL CARE IF:   You are unsure if you are in labor or if your water has broken.   You have dizziness.   You have mild pelvic cramps, pelvic pressure, or nagging pain in your abdominal area.   You have persistent nausea, vomiting, or diarrhea.   You have a bad smelling vaginal discharge.   You have pain with urination.  SEEK IMMEDIATE MEDICAL CARE IF:    You have a fever.   You are leaking fluid from your vagina.   You have spotting or bleeding from your vagina.     You have severe abdominal cramping or pain.   You have rapid weight loss or gain.   You have shortness of breath with chest pain.   You notice sudden or extreme swelling of your face, hands, ankles, feet, or legs.   You have not felt your baby move in over an hour.   You have severe headaches that do not go away with medicine.   You have vision changes.  Document Released: 07/17/2001 Document Revised: 03/25/2013 Document Reviewed: 09/23/2012  [employer] Patient Information 2014 [employer].

## 2013-12-04 NOTE — MAU Note (Signed)
[facility].  Pt's Grandmother passed away this am.  She was Tachycardia in the 140's during her visit in [facility].  Pt denies any discomfort and states, "I feel calm".  No vaginal bleeding or ROM.  Good fetal movement.

## 2013-12-04 NOTE — ED Notes (Signed)
Report called to J. Spurlock-Frizzel RN regarding pts tachycardia.  Pt to MAU.

## 2013-12-09 ENCOUNTER — Other Ambulatory Visit: Payer: Self-pay | Admitting: Obstetrics and Gynecology

## 2013-12-09 DIAGNOSIS — O365990 Maternal care for other known or suspected poor fetal growth, unspecified trimester, not applicable or unspecified: Secondary | ICD-10-CM

## 2013-12-09 NOTE — MAU Provider Note (Signed)
Pt seen. Appears to be in no acute distress. Labs and EKG reviewed which are normal. Fetal tracing Cat 1. Tachycardia trended down in MAU. Discharge home.

## 2013-12-11 ENCOUNTER — Ambulatory Visit (HOSPITAL_COMMUNITY)
Admission: RE | Admit: 2013-12-11 | Discharge: 2013-12-11 | Disposition: A | Discharge status: Home / Self Care | Payer: BC Managed Care – PPO | Source: Ambulatory Visit | Attending: Obstetrics and Gynecology | Admitting: Obstetrics and Gynecology

## 2013-12-11 VITALS — BP 134/81 | HR 123 | Wt 228.0 lb

## 2013-12-11 DIAGNOSIS — O365990 Maternal care for other known or suspected poor fetal growth, unspecified trimester, not applicable or unspecified: Secondary | ICD-10-CM | POA: Insufficient documentation

## 2013-12-11 DIAGNOSIS — Z3689 Encounter for other specified antenatal screening: Secondary | ICD-10-CM | POA: Insufficient documentation

## 2013-12-18 ENCOUNTER — Encounter (HOSPITAL_COMMUNITY): Payer: Self-pay

## 2013-12-18 ENCOUNTER — Inpatient Hospital Stay (HOSPITAL_COMMUNITY)
Admission: AD | Admit: 2013-12-18 | Discharge: 2013-12-18 | Disposition: A | Discharge status: Home / Self Care | Payer: BC Managed Care – PPO | Source: Ambulatory Visit | Attending: Obstetrics and Gynecology | Admitting: Obstetrics and Gynecology

## 2013-12-18 ENCOUNTER — Encounter (HOSPITAL_COMMUNITY): Payer: Self-pay | Admitting: *Deleted

## 2013-12-18 ENCOUNTER — Ambulatory Visit (HOSPITAL_COMMUNITY)
Admission: RE | Admit: 2013-12-18 | Discharge: 2013-12-18 | Disposition: A | Discharge status: Home / Self Care | Payer: BC Managed Care – PPO | Source: Ambulatory Visit | Attending: Obstetrics and Gynecology | Admitting: Obstetrics and Gynecology

## 2013-12-18 ENCOUNTER — Inpatient Hospital Stay (HOSPITAL_COMMUNITY): Payer: BC Managed Care – PPO

## 2013-12-18 DIAGNOSIS — O365990 Maternal care for other known or suspected poor fetal growth, unspecified trimester, not applicable or unspecified: Secondary | ICD-10-CM

## 2013-12-18 DIAGNOSIS — Z8249 Family history of ischemic heart disease and other diseases of the circulatory system: Secondary | ICD-10-CM | POA: Insufficient documentation

## 2013-12-18 DIAGNOSIS — O99891 Other specified diseases and conditions complicating pregnancy: Secondary | ICD-10-CM | POA: Insufficient documentation

## 2013-12-18 DIAGNOSIS — R000 Tachycardia, unspecified: Secondary | ICD-10-CM | POA: Insufficient documentation

## 2013-12-18 DIAGNOSIS — O9989 Other specified diseases and conditions complicating pregnancy, childbirth and the puerperium: Principal | ICD-10-CM

## 2013-12-18 DIAGNOSIS — Z87891 Personal history of nicotine dependence: Secondary | ICD-10-CM | POA: Insufficient documentation

## 2013-12-18 DIAGNOSIS — Z3689 Encounter for other specified antenatal screening: Secondary | ICD-10-CM | POA: Insufficient documentation

## 2013-12-18 LAB — URINALYSIS, ROUTINE W REFLEX MICROSCOPIC
Bilirubin Urine: NEGATIVE
Glucose, UA: NEGATIVE mg/dL
Hgb urine dipstick: NEGATIVE
Ketones, ur: NEGATIVE mg/dL
Leukocytes, UA: NEGATIVE
NITRITE: NEGATIVE
Protein, ur: NEGATIVE mg/dL
Urobilinogen, UA: 0.2 mg/dL (ref 0.0–1.0)
pH: 6 (ref 5.0–8.0)

## 2013-12-18 MED ORDER — IOHEXOL 350 MG/ML SOLN
100.0000 mL | Freq: Once | INTRAVENOUS | Status: AC | PRN
  Administered 2013-12-18: 100 mL via INTRAVENOUS

## 2013-12-18 NOTE — MAU Provider Note (Signed)
History     CSN: [id number]  Arrival date and time: 12/18/13 1108   First Provider Initiated Contact with Patient 12/18/13 1206      Chief Complaint  Patient presents with  . Tachycardia   HPI  Ms. Cheryl [name] is a 23 y.o. female G1P0000 at [redacted]w[redacted]d who presents with Tachycardia. The patient was at [facility] for an appointment and was sent here for further evaluation of tachycardia; the patients HR was in the 140's in [facility]. The patient had similar complaints 2 weeks ago and had an EKG that showed sinus tachycardia. The patient does not complain of any other associated symptoms; denies SOB, dizziness, weakness or chest pain. The patient does not feel her heart beating fast, she has never seen a cardiologist. She reports good fetal movement.   OB History   Grav Para Term Preterm Abortions TAB SAB Ect Mult Living   1 0 0 0 0 0 0 0 0 0       Past Medical History  Diagnosis Date  . Medical history non-contributory     Past Surgical History  Procedure Laterality Date  . Tonsillectomy      Family History  Problem Relation Age of Onset  . Hypertension Mother   . Depression Mother   . Parkinson's disease Mother   . Diabetes Father   . Hypertension Father   . Diabetes Maternal Grandmother   . Cancer Maternal Grandmother   . Hyperlipidemia Maternal Grandmother   . Hyperlipidemia Paternal Grandmother   . Diabetes Paternal Grandmother   . Parkinson's disease Paternal Grandmother   . Diabetes Paternal Grandfather   . Hyperlipidemia Paternal Grandfather     History  Substance Use Topics  . Smoking status: Former Games developer  . Smokeless tobacco: Never Used  . Alcohol Use: No    Allergies:  Allergies  Allergen Reactions  . Erythromycin Itching  . Oxycontin [Oxycodone Hcl] Itching  . Vicodin [Hydrocodone-Acetaminophen] Itching  . Mold Extract [Trichophyton] Rash    Prescriptions prior to admission  Medication Sig Dispense Refill  . acetaminophen (TYLENOL) 325 MG tablet  Take 325 mg by mouth every 6 (six) hours as needed for headache.      . calcium carbonate (TUMS - DOSED IN MG ELEMENTAL CALCIUM) 500 MG chewable tablet Chew 2 tablets by mouth daily as needed for indigestion or heartburn.      . cetirizine (ZYRTEC) 5 MG tablet Take 5 mg by mouth daily.      . Doxylamine-Pyridoxine 10-10 MG TBEC Take 2 tablets by mouth at bedtime.      . Prenatal Vit-Fe Fumarate-FA (PRENATAL MULTIVITAMIN) TABS tablet Take 1 tablet by mouth daily at 12 noon.       Results for orders placed during the hospital encounter of 12/18/13 (from the past 48 hour(s))  URINALYSIS, ROUTINE W REFLEX MICROSCOPIC     Status: Abnormal   Collection Time    12/18/13 11:29 AM      Result Value Ref Range   Color, Urine YELLOW  YELLOW   APPearance CLEAR  CLEAR   Specific Gravity, Urine <1.005 (*) 1.005 - 1.030   pH 6.0  5.0 - 8.0   Glucose, UA NEGATIVE  NEGATIVE mg/dL   Hgb urine dipstick NEGATIVE  NEGATIVE   Bilirubin Urine NEGATIVE  NEGATIVE   Ketones, ur NEGATIVE  NEGATIVE mg/dL   Protein, ur NEGATIVE  NEGATIVE mg/dL   Urobilinogen, UA 0.2  0.0 - 1.0 mg/dL   Nitrite NEGATIVE  NEGATIVE   Leukocytes, UA  NEGATIVE  NEGATIVE   Comment: MICROSCOPIC NOT DONE ON URINES WITH NEGATIVE PROTEIN, BLOOD, LEUKOCYTES, NITRITE, OR GLUCOSE <1000 mg/dL.   Ct Angio Chest W/cm &/or Wo Cm  12/18/2013   CLINICAL DATA:  Chest pain and tachycardia ; third trimester gestation  EXAM: CT ANGIOGRAPHY CHEST WITH CONTRAST  TECHNIQUE: Multidetector CT imaging of the chest was performed using the standard protocol during bolus administration of intravenous contrast. Multiplanar CT image reconstructions and MIPs were obtained to evaluate the vascular anatomy.  CONTRAST:  100mL OMNIPAQUE IOHEXOL 350 MG/ML SOLN  COMPARISON:  None.  FINDINGS: A portion of the lung bases is not visualized in order to minimize radiation dose due to the pregnancy.  There is no demonstrable pulmonary embolus. There is no appreciable thoracic aortic  aneurysm or dissection.  The visualized lungs are clear except for an area of mild atelectatic change in the posterior aspect of the superior segment of the left lower lobe. There is no appreciable thoracic adenopathy. There is residual thymic tissue, within normal limits in this age group. Pericardium is not thickened.  In the visualized upper abdomen, there is fatty change in the liver. Visualized upper abdominal structures, limited in this case, otherwise appear normal. There are no blastic or lytic bone lesions.  Review of the MIP images confirms the above findings.  IMPRESSION: No demonstrable pulmonary embolus. Mild left lower lobe atelectatic change. Fatty liver.   Electronically Signed   By: Bretta BangWilliam  Woodruff M.D.   On: 12/18/2013 14:42   [place] Fetal Bpp W/o Non Stress  12/18/2013   OBSTETRICAL ULTRASOUND: This exam was performed within a [facility]. The OB US report was generated in the AS system, and faxed to the ordering physician.   This report is also available in [facility] [employer] and in the [employer] [employer] PACS. See AS Obstetric US report.  [place] Ua Cord Doppler  12/18/2013   OBSTETRICAL ULTRASOUND: This exam was performed within a [facility]. The OB US report was generated in the AS system, and faxed to the ordering physician.   This report is also available in [facility] [employer] and in the [facility] [employer] PACS. See AS Obstetric US report.  Review of Systems  Constitutional: Negative for fever and chills.  Respiratory: Negative for shortness of breath.   Cardiovascular: Negative for chest pain.  Gastrointestinal: Negative for heartburn, nausea, vomiting and abdominal pain.  Neurological: Negative for dizziness, weakness and headaches.   Physical Exam   Blood pressure 126/80, pulse 106, temperature 98.4 F (36.9 C), last menstrual period 05/01/2013, SpO2 98.00%.  Physical Exam  Constitutional: She is oriented to person, place,  and time. She appears well-developed and well-nourished.  Non-toxic appearance. She does not have a sickly appearance. She does not appear ill. No distress.  HENT:  Head: Normocephalic.  Eyes: Pupils are equal, round, and reactive to light.  Neck: Normal range of motion. Neck supple.  Cardiovascular: Regular rhythm.  Tachycardia present.   Respiratory: Effort normal and breath sounds normal. No respiratory distress. She has no wheezes. She has no rales. She exhibits no tenderness.  Musculoskeletal: Normal range of motion.  Neurological: She is alert and oriented to person, place, and time.  Skin: Skin is warm. She is not diaphoretic.  Psychiatric: Her behavior is normal.   Fetal Tracing: Baseline: 140 bpm  Variability: Moderate  Accelerations: 15x15 Decelerations: None Toco: No contractions noted    MAU Course  Procedures None  MDM Dr. Dion BodyVarnado called and spoke to RN;  Spiral CT ordered per Dr. Dion BodyVarnado Medical screen complete by NP  RN Called Dr. Dion BodyVarnado with CT results. Pt discharged home Dr. Dion BodyVarnado at bedside to see patient.   Assessment and Plan   A:  Tachycardia in pregnancy; third trimester PE ruled out by spiral CT scan.   P: Discharge home per Dr. Dion BodyVarnado Follow up as scheduled.   Cheryl HansenJennifer Irene Chancie Lampert, NP  12/18/2013, 3:05 PM

## 2013-12-18 NOTE — MAU Note (Signed)
Pt sent over from [facility] after her NST. Pt HR up in the 140's. Denies pain or SOB and stated she does not feel like her heart is racing.

## 2013-12-23 ENCOUNTER — Other Ambulatory Visit: Payer: Self-pay | Admitting: Obstetrics and Gynecology

## 2013-12-23 ENCOUNTER — Other Ambulatory Visit (HOSPITAL_COMMUNITY): Payer: BC Managed Care – PPO

## 2013-12-23 ENCOUNTER — Encounter (HOSPITAL_COMMUNITY): Payer: Self-pay

## 2013-12-23 ENCOUNTER — Ambulatory Visit (HOSPITAL_COMMUNITY)
Admission: RE | Admit: 2013-12-23 | Discharge: 2013-12-23 | Disposition: A | Discharge status: Home / Self Care | Payer: BC Managed Care – PPO | Source: Ambulatory Visit | Attending: Obstetrics and Gynecology | Admitting: Obstetrics and Gynecology

## 2013-12-23 VITALS — BP 128/74 | HR 96 | Wt 225.2 lb

## 2013-12-23 DIAGNOSIS — Z3689 Encounter for other specified antenatal screening: Secondary | ICD-10-CM | POA: Insufficient documentation

## 2013-12-23 DIAGNOSIS — O365990 Maternal care for other known or suspected poor fetal growth, unspecified trimester, not applicable or unspecified: Secondary | ICD-10-CM

## 2013-12-25 ENCOUNTER — Other Ambulatory Visit (HOSPITAL_COMMUNITY): Payer: BC Managed Care – PPO

## 2013-12-30 NOTE — MAU Provider Note (Signed)
Pt was seen and evaluated. Grandmother just passed but she feels fine emotionally. PE unremarkable (CV, Lungs Abd, Ext) HR nl, EKG nl sinus tachycardia. Await CT results. A/P: Tachycardia likely due to Anxiety. R/o PE.

## 2014-01-01 ENCOUNTER — Ambulatory Visit (HOSPITAL_COMMUNITY)
Admission: RE | Admit: 2014-01-01 | Discharge: 2014-01-01 | Disposition: A | Discharge status: Home / Self Care | Payer: BC Managed Care – PPO | Source: Ambulatory Visit | Attending: Obstetrics and Gynecology | Admitting: Obstetrics and Gynecology

## 2014-01-01 ENCOUNTER — Encounter (HOSPITAL_COMMUNITY): Payer: Self-pay

## 2014-01-01 DIAGNOSIS — Z3689 Encounter for other specified antenatal screening: Secondary | ICD-10-CM | POA: Insufficient documentation

## 2014-01-01 DIAGNOSIS — O365990 Maternal care for other known or suspected poor fetal growth, unspecified trimester, not applicable or unspecified: Secondary | ICD-10-CM | POA: Insufficient documentation

## 2014-01-08 ENCOUNTER — Ambulatory Visit (HOSPITAL_COMMUNITY)
Admission: RE | Admit: 2014-01-08 | Discharge: 2014-01-08 | Disposition: A | Discharge status: Home / Self Care | Payer: BC Managed Care – PPO | Source: Ambulatory Visit | Attending: Obstetrics and Gynecology | Admitting: Obstetrics and Gynecology

## 2014-01-08 ENCOUNTER — Encounter (HOSPITAL_COMMUNITY): Payer: Self-pay

## 2014-01-08 DIAGNOSIS — Z3689 Encounter for other specified antenatal screening: Secondary | ICD-10-CM | POA: Insufficient documentation

## 2014-01-08 DIAGNOSIS — O365990 Maternal care for other known or suspected poor fetal growth, unspecified trimester, not applicable or unspecified: Secondary | ICD-10-CM | POA: Insufficient documentation

## 2014-01-15 ENCOUNTER — Ambulatory Visit (HOSPITAL_COMMUNITY)
Admission: RE | Admit: 2014-01-15 | Discharge: 2014-01-15 | Disposition: A | Discharge status: Home / Self Care | Payer: BC Managed Care – PPO | Source: Ambulatory Visit | Attending: Obstetrics and Gynecology | Admitting: Obstetrics and Gynecology

## 2014-01-15 ENCOUNTER — Encounter (HOSPITAL_COMMUNITY): Payer: Self-pay

## 2014-01-15 DIAGNOSIS — Z3689 Encounter for other specified antenatal screening: Secondary | ICD-10-CM | POA: Insufficient documentation

## 2014-01-15 DIAGNOSIS — O365990 Maternal care for other known or suspected poor fetal growth, unspecified trimester, not applicable or unspecified: Secondary | ICD-10-CM | POA: Insufficient documentation

## 2014-01-18 ENCOUNTER — Other Ambulatory Visit: Payer: Self-pay | Admitting: Obstetrics and Gynecology

## 2014-01-18 DIAGNOSIS — O365990 Maternal care for other known or suspected poor fetal growth, unspecified trimester, not applicable or unspecified: Secondary | ICD-10-CM

## 2014-01-18 LAB — OB RESULTS CONSOLE GBS: GBS: NEGATIVE

## 2014-01-22 ENCOUNTER — Ambulatory Visit (HOSPITAL_COMMUNITY)
Admission: RE | Admit: 2014-01-22 | Discharge: 2014-01-22 | Disposition: A | Discharge status: Home / Self Care | Payer: BC Managed Care – PPO | Source: Ambulatory Visit | Attending: Obstetrics and Gynecology | Admitting: Obstetrics and Gynecology

## 2014-01-22 ENCOUNTER — Encounter (HOSPITAL_COMMUNITY): Payer: Self-pay

## 2014-01-22 VITALS — BP 138/87 | HR 135 | Wt 231.0 lb

## 2014-01-22 DIAGNOSIS — O365931 Maternal care for other known or suspected poor fetal growth, third trimester, fetus 1: Secondary | ICD-10-CM

## 2014-01-22 DIAGNOSIS — O365990 Maternal care for other known or suspected poor fetal growth, unspecified trimester, not applicable or unspecified: Secondary | ICD-10-CM

## 2014-01-22 DIAGNOSIS — Z3689 Encounter for other specified antenatal screening: Secondary | ICD-10-CM | POA: Insufficient documentation

## 2014-01-27 ENCOUNTER — Telehealth (HOSPITAL_COMMUNITY): Payer: Self-pay | Admitting: *Deleted

## 2014-01-27 ENCOUNTER — Other Ambulatory Visit: Payer: Self-pay | Admitting: Obstetrics and Gynecology

## 2014-01-27 NOTE — Telephone Encounter (Signed)
Preadmission screen  

## 2014-01-27 NOTE — Progress Notes (Addendum)
Pt has documented Chiari  Malformation Type 1.  MRI 09/2012 noted in EPIC.  D/w Dr. Rodman Pickleassidy regarding risk of epidural or spinal.  She recommended I clear this with Neurology. Today, I spoke with the Neurologist on call, Dr. Noel Christmasharles Stewart.  He states there is no contraindication for spinal or anesthesia.  Pt would only be at risk if she hyperextended her neck.  No further evaluation warranted. Results of this conversation shared with Dr. Rodman Pickleassidy.

## 2014-01-27 NOTE — Progress Notes (Signed)
Spoke with Dr Dion BodyVarnado via phone.  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  Dr Dion BodyVarnado spoke with neurologist on call who said there is no contraindication to neuraxial anesthesia.  He did warn against hyperextending her neck.  Jasmine DecemberA. Cassidy, MD

## 2014-01-29 ENCOUNTER — Inpatient Hospital Stay (HOSPITAL_COMMUNITY)
Admission: AD | Admit: 2014-01-29 | Discharge: 2014-02-01 | DRG: 775 | Disposition: A | Discharge status: Home / Self Care | Payer: BC Managed Care – PPO | Source: Ambulatory Visit | Attending: Obstetrics and Gynecology | Admitting: Obstetrics and Gynecology

## 2014-01-29 ENCOUNTER — Encounter (HOSPITAL_COMMUNITY): Payer: Self-pay | Admitting: General Practice

## 2014-01-29 ENCOUNTER — Ambulatory Visit (HOSPITAL_COMMUNITY)
Admission: RE | Admit: 2014-01-29 | Discharge: 2014-01-29 | Disposition: A | Discharge status: Home / Self Care | Payer: BC Managed Care – PPO | Source: Ambulatory Visit | Attending: Obstetrics and Gynecology | Admitting: Obstetrics and Gynecology

## 2014-01-29 ENCOUNTER — Encounter (HOSPITAL_COMMUNITY): Payer: Self-pay

## 2014-01-29 DIAGNOSIS — O99344 Other mental disorders complicating childbirth: Secondary | ICD-10-CM | POA: Diagnosis present

## 2014-01-29 DIAGNOSIS — Z6839 Body mass index (BMI) 39.0-39.9, adult: Secondary | ICD-10-CM

## 2014-01-29 DIAGNOSIS — O99214 Obesity complicating childbirth: Secondary | ICD-10-CM

## 2014-01-29 DIAGNOSIS — E669 Obesity, unspecified: Secondary | ICD-10-CM | POA: Diagnosis present

## 2014-01-29 DIAGNOSIS — O365990 Maternal care for other known or suspected poor fetal growth, unspecified trimester, not applicable or unspecified: Secondary | ICD-10-CM | POA: Diagnosis present

## 2014-01-29 DIAGNOSIS — O411090 Infection of amniotic sac and membranes, unspecified, unspecified trimester, not applicable or unspecified: Secondary | ICD-10-CM | POA: Diagnosis present

## 2014-01-29 DIAGNOSIS — F411 Generalized anxiety disorder: Secondary | ICD-10-CM | POA: Diagnosis present

## 2014-01-29 LAB — LACTATE DEHYDROGENASE: LDH: 164 U/L (ref 94–250)

## 2014-01-29 LAB — COMPREHENSIVE METABOLIC PANEL
ALK PHOS: 99 U/L (ref 39–117)
ALT: 11 U/L (ref 0–35)
AST: 13 U/L (ref 0–37)
Albumin: 2.9 g/dL — ABNORMAL LOW (ref 3.5–5.2)
BILIRUBIN TOTAL: 0.4 mg/dL (ref 0.3–1.2)
BUN: 6 mg/dL (ref 6–23)
CHLORIDE: 104 meq/L (ref 96–112)
CO2: 23 meq/L (ref 19–32)
Calcium: 9.7 mg/dL (ref 8.4–10.5)
Creatinine, Ser: 0.52 mg/dL (ref 0.50–1.10)
GFR calc Af Amer: >90 mL/min (ref >90)
Glucose, Bld: 88 mg/dL (ref 70–99)
POTASSIUM: 3.8 meq/L (ref 3.7–5.3)
SODIUM: 140 meq/L (ref 137–147)
Total Protein: 6.3 g/dL (ref 6.0–8.3)

## 2014-01-29 LAB — URIC ACID: Uric Acid, Serum: 3 mg/dL (ref 2.4–7.0)

## 2014-01-29 LAB — CBC
HCT: 37.9 % (ref 36.0–46.0)
Hemoglobin: 13 g/dL (ref 12.0–15.0)
MCH: 30.4 pg (ref 26.0–34.0)
MCHC: 34.3 g/dL (ref 30.0–36.0)
MCV: 88.6 fL (ref 78.0–100.0)
PLATELETS: 198 10*3/uL (ref 150–400)
RBC: 4.28 MIL/uL (ref 3.87–5.11)
RDW: 13.5 % (ref 11.5–15.5)
WBC: 10 10*3/uL (ref 4.0–10.5)

## 2014-01-29 LAB — PROTEIN / CREATININE RATIO, URINE
Creatinine, Urine: 21.38 mg/dL
Protein Creatinine Ratio: 0.24 — ABNORMAL HIGH (ref 0.00–0.15)
Total Protein, Urine: 5.2 mg/dL

## 2014-01-29 LAB — RPR

## 2014-01-29 LAB — TYPE AND SCREEN
ABO/RH(D): A POS
Antibody Screen: NEGATIVE

## 2014-01-29 LAB — ABO/RH: ABO/RH(D): A POS

## 2014-01-29 MED ORDER — TERBUTALINE SULFATE 1 MG/ML IJ SOLN: 0.2500 mg | Freq: Once | INTRAMUSCULAR | Status: AC | PRN

## 2014-01-29 MED ORDER — ZOLPIDEM TARTRATE 5 MG PO TABS
5.0000 mg | ORAL_TABLET | Freq: Every evening | ORAL | Status: DC | PRN
  Administered 2014-01-29: 5 mg via ORAL
  Filled 2014-01-29: qty 1

## 2014-01-29 MED ORDER — LACTATED RINGERS IV SOLN: 500.0000 mL | INTRAVENOUS | Status: DC | PRN

## 2014-01-29 MED ORDER — OXYCODONE-ACETAMINOPHEN 5-325 MG PO TABS: 1.0000 | ORAL_TABLET | ORAL | Status: DC | PRN

## 2014-01-29 MED ORDER — OXYTOCIN 40 UNITS IN LACTATED RINGERS INFUSION - SIMPLE MED
1.0000 m[IU]/min | INTRAVENOUS | Status: DC
  Administered 2014-01-30: 2 m[IU]/min via INTRAVENOUS
  Filled 2014-01-29: qty 1000

## 2014-01-29 MED ORDER — HYDROXYZINE HCL 50 MG PO TABS
50.0000 mg | ORAL_TABLET | Freq: Four times a day (QID) | ORAL | Status: DC | PRN
  Administered 2014-01-30: 50 mg via ORAL
  Filled 2014-01-29 (×2): qty 1

## 2014-01-29 MED ORDER — BUTORPHANOL TARTRATE 1 MG/ML IJ SOLN
1.0000 mg | INTRAMUSCULAR | Status: DC | PRN
  Administered 2014-01-29 (×2): 1 mg via INTRAVENOUS
  Filled 2014-01-29 (×3): qty 1

## 2014-01-29 MED ORDER — MISOPROSTOL 25 MCG QUARTER TABLET
25.0000 ug | ORAL_TABLET | ORAL | Status: DC | PRN
  Administered 2014-01-29 – 2014-01-30 (×5): 25 ug via VAGINAL
  Filled 2014-01-29 (×5): qty 0.25
  Filled 2014-01-29: qty 1

## 2014-01-29 MED ORDER — OXYTOCIN 40 UNITS IN LACTATED RINGERS INFUSION - SIMPLE MED: 62.5000 mL/h | INTRAVENOUS | Status: DC

## 2014-01-29 MED ORDER — ONDANSETRON HCL 4 MG/2ML IJ SOLN
4.0000 mg | Freq: Four times a day (QID) | INTRAMUSCULAR | Status: DC | PRN
  Administered 2014-01-30: 4 mg via INTRAVENOUS
  Filled 2014-01-29: qty 2

## 2014-01-29 MED ORDER — LACTATED RINGERS IV SOLN
INTRAVENOUS | Status: DC
  Administered 2014-01-29 – 2014-01-30 (×4): via INTRAVENOUS

## 2014-01-29 MED ORDER — CITRIC ACID-SODIUM CITRATE 334-500 MG/5ML PO SOLN: 30.0000 mL | ORAL | Status: DC | PRN

## 2014-01-29 MED ORDER — LIDOCAINE HCL (PF) 1 % IJ SOLN
30.0000 mL | INTRAMUSCULAR | Status: DC | PRN
  Filled 2014-01-29: qty 30

## 2014-01-29 MED ORDER — OXYTOCIN BOLUS FROM INFUSION: 500.0000 mL | INTRAVENOUS | Status: DC

## 2014-01-29 MED ORDER — ACETAMINOPHEN 325 MG PO TABS: 650.0000 mg | ORAL_TABLET | ORAL | Status: DC | PRN

## 2014-01-29 MED ORDER — IBUPROFEN 600 MG PO TABS: 600.0000 mg | ORAL_TABLET | Freq: Four times a day (QID) | ORAL | Status: DC | PRN

## 2014-01-29 NOTE — Progress Notes (Signed)
  Subjective: Pt reports increased cramping since 2nd dose of Cytotec at 1845.  Family at bedside and supportive. Introduced myself.   Objective: BP 136/74  Pulse 83  Temp(Src) 97.9 F (36.6 C) (Oral)  Resp 18  Ht 5\' 4"  (1.626 m)  Wt 232 lb (105.235 kg)  BMI 39.80 kg/m2  LMP 05/01/2013      FHT: Category I UC:   irregular  SVE:   Deferred   Induction of labor due to IUGR,  2nd dose of cytotec placed at 1845  Labor: Reports increased cramping, will evaluate at 2245 for furhter induction plans  Preeclampsia: BPs 125/69 and 136/74, No s/s of pre-e  Fetal Wellbeing: Category I  Pain Control: Labor support without medications  I/D: GBS neg; Intact; Afebrile  Anticipated MOD: NSVD    OXLEY, JENNIFER 11/07/2013, 7:23 AM

## 2014-01-29 NOTE — H&P (Signed)
Cheryl [name] is a 23 y.o. female G1 at 2739 0/7 weeks by an EDD 02/05/2014 c/w 6 week ultrasound.  Pt sent from [facility] office today for IOL due to elevated BP in office.   Pt has been followed with weekly BPP and dopplers due asymmetric growth, IUGR.  EFW 23% but AC has been less than 3%.  Ultrasound today was normal but pt's BP was 140/82.  Upon arrival to MAU, BPs were lower 109/77, 113/72.  BPs have been 120s/70s x 2 months in our office. Pt has a h/o anxiety.  Pt has been sent from [facility]FM to MAU twice for observation due to tachycardia, HR up to150s. EKG was normal except sinus tachycardia and spiral CT was negative for PE.  Tachycardia resolves and is normal after leaving MFM.  HR has been in the 90s at our office.  Pt feels she got anxious today and BP became elevated.  Denies headaches, visual changes, rapid swelling. D/w Dr. Marjo Bickerenney, [facility].  Recommends IOL for IUGR at 39 weeks even if she does not have GHTN.  Pt agreeable. PNC has been o/w uncomplicated.  Maternal Medical History:  Reason for admission: Elevated BP at MFM.  Contractions: Frequency: rare.    Fetal activity: Perceived fetal activity is normal.    Prenatal complications: IUGR.   Tachycardia due to anxiety.  Prenatal Complications - Diabetes: none.    OB History   Grav Para Term Preterm Abortions TAB SAB Ect Mult Living   1 0 0 0 0 0 0 0 0 0      Past Medical History  Diagnosis Date  . Medical history non-contributory   Anxiety  Past Surgical History  Procedure Laterality Date  . Tonsillectomy     Family History: family history includes Cancer in her maternal grandmother; Depression in her mother; Diabetes in her father, maternal grandmother, paternal grandfather, and paternal grandmother; Hyperlipidemia in her maternal grandmother, paternal grandfather, and paternal grandmother; Hypertension in her father and mother; Parkinson's disease in her mother and paternal grandmother. Social History:  reports that she has quit  smoking. She has never used smokeless tobacco. She reports that she does not drink alcohol or use illicit drugs.   Prenatal Transfer Tool  Maternal Diabetes: No Genetic Screening: Declined Maternal Ultrasounds/Referrals: Abnormal:  Findings:   Other:IUGR Fetal Ultrasounds or other Referrals:  Referred to Materal Fetal Medicine  Maternal Substance Abuse:  No Significant Maternal Medications:  None Significant Maternal Lab Results:  Lab values include: Group B Strep negative Other Comments:  AC less than 3%  Review of Systems  Eyes:       No visual changes.  Gastrointestinal: Negative for abdominal pain.  Neurological: Negative for headaches.    Dilation: Fingertip Effacement (%): 40 Station: -2;-3 Exam by:: Cheryl MooreM. Causey, RN Blood pressure 124/66, pulse 82, temperature 98.3 F (36.8 C), temperature source Oral, resp. rate 18, height 5\' 4"  (1.626 m), weight 105.235 kg (232 lb), last menstrual period 05/01/2013. Maternal Exam:  Uterine Assessment: Contraction strength is mild.  Contraction frequency is irregular.   Abdomen: Estimated fetal weight is 5 lbs, 10 oz (23%) on 01/15/2014.   Fetal presentation: vertex  Introitus: Normal vulva. Pt examined in office 2 days ago.  Pelvis: adequate for delivery.      Fetal Exam Fetal Monitor Review: Mode: fetoscope.   Variability: moderate (6-25 bpm).   Pattern: accelerations present.    Fetal State Assessment: Category I - tracings are normal.     Physical Exam  Constitutional: She  is oriented to person, place, and time. She appears well-developed and well-nourished. No distress.  HENT:  Head: Normocephalic and atraumatic.  Eyes: EOM are normal.  Neck: Normal range of motion.  Cardiovascular: Normal rate, regular rhythm and normal heart sounds.   Respiratory: Effort normal and breath sounds normal. No respiratory distress.  GI: Soft. There is no tenderness. There is no rebound and no guarding.  Genitourinary: Vagina normal.   Musculoskeletal: Normal range of motion. She exhibits edema.  Neurological: She is alert and oriented to person, place, and time. She has normal reflexes.  Skin: Skin is warm and dry. She is not diaphoretic.  Psychiatric: She has a normal mood and affect.    Prenatal labs: ABO, Rh: --/--/A POS, A POS (06/26 1430) Antibody: NEG (06/26 1430) Rubella:   RPR:    HBsAg:    HIV: Non-reactive (10/31 0000)  GBS: Negative (06/15 0000)   Normal labs.  Pr/Cr ratio-0.24  Assessment/Plan: IUP at 39 0/7 weeks IUGR/ AC less than the 3rd percentile.  Cat 1 tracing. Elevated BP due to anxiety.  BP now normal.  Neg preeclampsia w/u. Anxiety.  IOL with Cytotec until favorable then Pitocin. Stadol and epidural prn. Atarax 50 mg prn anxiety. Will check out at 7 pm to Cheryl [name] and Dr. Stefano GaulStringer to assume care.  POC reviewed with pt and husband at length.  Cheryl [name], Cheryl [name], 5:36 PM

## 2014-01-29 NOTE — MAU Note (Signed)
Pt from MFM; induction for hypertension in preg.  Pt states has a mild HA, denies visual changes, increased swelling or epigastric pain.

## 2014-01-29 NOTE — MAU Note (Deleted)
"  they are really a 10 now, feeling pressure"

## 2014-01-30 ENCOUNTER — Inpatient Hospital Stay (HOSPITAL_COMMUNITY): Payer: BC Managed Care – PPO | Admitting: Anesthesiology

## 2014-01-30 ENCOUNTER — Encounter (HOSPITAL_COMMUNITY): Payer: Self-pay | Admitting: Anesthesiology

## 2014-01-30 ENCOUNTER — Encounter (HOSPITAL_COMMUNITY): Payer: BC Managed Care – PPO | Admitting: Anesthesiology

## 2014-01-30 LAB — CBC
HEMATOCRIT: 36.4 % (ref 36.0–46.0)
Hemoglobin: 12.4 g/dL (ref 12.0–15.0)
MCH: 30 pg (ref 26.0–34.0)
MCHC: 34.1 g/dL (ref 30.0–36.0)
MCV: 88.1 fL (ref 78.0–100.0)
Platelets: 164 10*3/uL (ref 150–400)
RBC: 4.13 MIL/uL (ref 3.87–5.11)
RDW: 13.3 % (ref 11.5–15.5)
WBC: 12.4 10*3/uL — ABNORMAL HIGH (ref 4.0–10.5)

## 2014-01-30 LAB — HEPATITIS B SURFACE ANTIGEN: Hepatitis B Surface Ag: NEGATIVE

## 2014-01-30 MED ORDER — IBUPROFEN 600 MG PO TABS
600.0000 mg | ORAL_TABLET | Freq: Four times a day (QID) | ORAL | Status: DC
  Administered 2014-01-30 – 2014-02-01 (×6): 600 mg via ORAL
  Filled 2014-01-30 (×6): qty 1

## 2014-01-30 MED ORDER — SENNOSIDES-DOCUSATE SODIUM 8.6-50 MG PO TABS
2.0000 | ORAL_TABLET | ORAL | Status: DC
  Administered 2014-01-30 – 2014-01-31 (×2): 2 via ORAL
  Filled 2014-01-30 (×2): qty 2

## 2014-01-30 MED ORDER — DIPHENHYDRAMINE HCL 50 MG/ML IJ SOLN: 12.5000 mg | INTRAMUSCULAR | Status: DC | PRN

## 2014-01-30 MED ORDER — EPHEDRINE 5 MG/ML INJ
10.0000 mg | INTRAVENOUS | Status: DC | PRN
  Administered 2014-01-30: 10 mg via INTRAVENOUS
  Filled 2014-01-30: qty 2

## 2014-01-30 MED ORDER — BISACODYL 10 MG RE SUPP: 10.0000 mg | Freq: Every day | RECTAL | Status: DC | PRN

## 2014-01-30 MED ORDER — DIBUCAINE 1 % RE OINT: 1.0000 "application " | TOPICAL_OINTMENT | RECTAL | Status: DC | PRN

## 2014-01-30 MED ORDER — ONDANSETRON HCL 4 MG PO TABS
4.0000 mg | ORAL_TABLET | ORAL | Status: DC | PRN
Start: 2014-01-30 — End: 2014-02-01

## 2014-01-30 MED ORDER — TETANUS-DIPHTH-ACELL PERTUSSIS 5-2.5-18.5 LF-MCG/0.5 IM SUSP: 0.5000 mL | Freq: Once | INTRAMUSCULAR | Status: DC

## 2014-01-30 MED ORDER — LANOLIN HYDROUS EX OINT: TOPICAL_OINTMENT | CUTANEOUS | Status: DC | PRN

## 2014-01-30 MED ORDER — ZOLPIDEM TARTRATE 5 MG PO TABS: 5.0000 mg | ORAL_TABLET | Freq: Every evening | ORAL | Status: DC | PRN

## 2014-01-30 MED ORDER — EPHEDRINE 5 MG/ML INJ
10.0000 mg | INTRAVENOUS | Status: DC | PRN
  Filled 2014-01-30: qty 2
  Filled 2014-01-30 (×2): qty 4

## 2014-01-30 MED ORDER — FLEET ENEMA 7-19 GM/118ML RE ENEM: 1.0000 | ENEMA | Freq: Every day | RECTAL | Status: DC | PRN

## 2014-01-30 MED ORDER — PHENYLEPHRINE 40 MCG/ML (10ML) SYRINGE FOR IV PUSH (FOR BLOOD PRESSURE SUPPORT)
80.0000 ug | PREFILLED_SYRINGE | INTRAVENOUS | Status: DC | PRN
  Filled 2014-01-30: qty 2
  Filled 2014-01-30: qty 10

## 2014-01-30 MED ORDER — PHENYLEPHRINE 40 MCG/ML (10ML) SYRINGE FOR IV PUSH (FOR BLOOD PRESSURE SUPPORT)
80.0000 ug | PREFILLED_SYRINGE | INTRAVENOUS | Status: DC | PRN
  Filled 2014-01-30: qty 2

## 2014-01-30 MED ORDER — MEDROXYPROGESTERONE ACETATE 150 MG/ML IM SUSP: 150.0000 mg | INTRAMUSCULAR | Status: DC | PRN

## 2014-01-30 MED ORDER — ONDANSETRON HCL 4 MG/2ML IJ SOLN: 4.0000 mg | INTRAMUSCULAR | Status: DC | PRN

## 2014-01-30 MED ORDER — LIDOCAINE HCL (PF) 1 % IJ SOLN
INTRAMUSCULAR | Status: DC | PRN
  Administered 2014-01-30: 9 mL
  Administered 2014-01-30: 8 mL

## 2014-01-30 MED ORDER — WITCH HAZEL-GLYCERIN EX PADS: 1.0000 "application " | MEDICATED_PAD | CUTANEOUS | Status: DC | PRN

## 2014-01-30 MED ORDER — PRENATAL MULTIVITAMIN CH
1.0000 | ORAL_TABLET | Freq: Every day | ORAL | Status: DC
  Administered 2014-01-31: 1 via ORAL

## 2014-01-30 MED ORDER — DIPHENHYDRAMINE HCL 25 MG PO CAPS: 25.0000 mg | ORAL_CAPSULE | Freq: Four times a day (QID) | ORAL | Status: DC | PRN

## 2014-01-30 MED ORDER — MEASLES, MUMPS & RUBELLA VAC ~~LOC~~ INJ
0.5000 mL | INJECTION | Freq: Once | SUBCUTANEOUS | Status: AC
Start: 2014-01-31 — End: 2014-02-01
  Administered 2014-02-01: 0.5 mL via SUBCUTANEOUS
  Filled 2014-01-30 (×2): qty 0.5

## 2014-01-30 MED ORDER — FENTANYL 2.5 MCG/ML BUPIVACAINE 1/10 % EPIDURAL INFUSION (WH - ANES)
INTRAMUSCULAR | Status: DC | PRN
  Administered 2014-01-30: 14 mL/h via EPIDURAL

## 2014-01-30 MED ORDER — SIMETHICONE 80 MG PO CHEW: 80.0000 mg | CHEWABLE_TABLET | ORAL | Status: DC | PRN

## 2014-01-30 MED ORDER — BENZOCAINE-MENTHOL 20-0.5 % EX AERO
1.0000 "application " | INHALATION_SPRAY | CUTANEOUS | Status: DC | PRN
  Filled 2014-01-30: qty 56

## 2014-01-30 MED ORDER — OXYCODONE-ACETAMINOPHEN 5-325 MG PO TABS: 1.0000 | ORAL_TABLET | ORAL | Status: DC | PRN

## 2014-01-30 MED ORDER — LACTATED RINGERS IV SOLN
500.0000 mL | Freq: Once | INTRAVENOUS | Status: AC
  Administered 2014-01-30: 500 mL via INTRAVENOUS

## 2014-01-30 MED ORDER — FENTANYL 2.5 MCG/ML BUPIVACAINE 1/10 % EPIDURAL INFUSION (WH - ANES)
14.0000 mL/h | INTRAMUSCULAR | Status: DC | PRN
  Filled 2014-01-30: qty 125

## 2014-01-30 NOTE — Progress Notes (Signed)
Called to room and pt found to be in the restroom, puddles of fluid on the floor noted to the restroom.

## 2014-01-30 NOTE — Progress Notes (Signed)
  Subjective: 3rd dose of cytotec placed by RN.  Family at bedside and supportive.  Objective: BP 112/61  Pulse 77  Temp(Src) 98.9 F (37.2 C) (Oral)  Resp 16  Ht 5\' 4"  (1.626 m)  Wt 232 lb (105.235 kg)  BMI 39.80 kg/m2  LMP 05/01/2013      FHT: Category I UC:   occasional  SVE:   1 / 50 / -3,-2   Induction of labor due to IUGR,  3rd dose of Cytotec placed at 2302  Labor: Progressing normally  Preeclampsia: BP 112/61  Fetal Wellbeing: Category I  Pain Control: Labor support without medications  I/D: GBS neg; Intact; Afebrile   Anticipated MOD: NSVD    Cheryl [name] 11/07/2013, 7:23 AM

## 2014-01-30 NOTE — Progress Notes (Signed)
  Subjective: Increased pain w/ ctxs   Objective: BP 113/57  Pulse 69  Temp(Src) 97.8 F (36.6 C) (Oral)  Resp 22  Ht 5\' 4"  (1.626 m)  Wt 232 lb (105.235 kg)  BMI 39.80 kg/m2  LMP 05/01/2013   Total I/O In: -  Out: 450 [Emesis/NG output:450]  FHT: Category 1 UC:   Irritability SVE:   Dilation: 1 Effacement (%): 50 Station: -2 Exam by:: Lynda RainwaterPAmela Lawson RN    Assessment:  IOL SROM  Anxiety GBS neg  Plan: Will not place foley bulb due to SROM May have epidural Begin Pitocin after epidural placement Consult prn  Sherre ScarletWILLIAMS, KIMBERLY CNM 01/30/2014, 11:24 AM

## 2014-01-30 NOTE — Progress Notes (Signed)
Subjective: Assumed care. Pt. Throwing up as I entered room while attempting to eat regular food.  Objective: BP 127/76  Pulse 88  Temp(Src) 97.8 F (36.6 C) (Oral)  Resp 18  Ht 5\' 4"  (1.626 m)  Wt 232 lb (105.235 kg)  BMI 39.80 kg/m2  LMP 05/01/2013   Total I/O In: -  Out: 450 [Emesis/NG output:450]   Maternal Vitals      BP    112/61    134/54    127/76   BP   Temp    98.9 (37.2)    98.2 (36.8)    97.8 (36.6)   Temp   Temp src    Oral    Oral    Oral   Temp src   Pulse Rate    77    111    88   Pulse Rate   Resp    16    16   16 18    Resp    FHT: Category 1 UC:   irritability SVE:   Dilation: 1 (Cytotec Placed) Effacement (%): 50 Station: -2 Exam by:: Lynda RainwaterPamela Lawson RN   Has had 5 doses of Cytotec. Last dose placed at 0744.  Results for orders placed during the hospital encounter of 01/29/14 (from the past 24 hour(s))  CBC     Status: None   Collection Time    01/29/14 11:25 AM      Result Value Ref Range   WBC 10.0  4.0 - 10.5 K/uL   RBC 4.28  3.87 - 5.11 MIL/uL   Hemoglobin 13.0  12.0 - 15.0 g/dL   HCT 16.137.9  09.636.0 - 04.546.0 %   MCV 88.6  78.0 - 100.0 fL   MCH 30.4  26.0 - 34.0 pg   MCHC 34.3  30.0 - 36.0 g/dL   RDW 40.913.5  81.111.5 - 91.415.5 %   Platelets 198  150 - 400 K/uL  COMPREHENSIVE METABOLIC PANEL     Status: Abnormal   Collection Time    01/29/14 11:25 AM      Result Value Ref Range   Sodium 140  137 - 147 mEq/L   Potassium 3.8  3.7 - 5.3 mEq/L   Chloride 104  96 - 112 mEq/L   CO2 23  19 - 32 mEq/L   Glucose, Bld 88  70 - 99 mg/dL   BUN 6  6 - 23 mg/dL   Creatinine, Ser 7.820.52  0.50 - 1.10 mg/dL   Calcium 9.7  8.4 - 95.610.5 mg/dL   Total Protein 6.3  6.0 - 8.3 g/dL   Albumin 2.9 (*) 3.5 - 5.2 g/dL   AST 13  0 - 37 U/L   ALT 11  0 - 35 U/L   Alkaline Phosphatase 99  39 - 117 U/L   Total Bilirubin 0.4  0.3 - 1.2 mg/dL   GFR calc non Af Amer >90  >90 mL/min   GFR calc Af Amer >90  >90 mL/min  LACTATE DEHYDROGENASE     Status: None   Collection Time   01/29/14 11:25 AM      Result Value Ref Range   LDH 164  94 - 250 U/L  URIC ACID     Status: None   Collection Time    01/29/14 11:25 AM      Result Value Ref Range   Uric Acid, Serum 3.0  2.4 - 7.0 mg/dL  PROTEIN / CREATININE RATIO, URINE     Status: Abnormal  Collection Time    01/29/14 12:17 PM      Result Value Ref Range   Creatinine, Urine 21.38     Total Protein, Urine 5.2     PROTEIN CREATININE RATIO 0.24 (*) 0.00 - 0.15  RPR     Status: None   Collection Time    01/29/14  2:30 PM      Result Value Ref Range   RPR NON REAC  NON REAC  TYPE AND SCREEN     Status: None   Collection Time    01/29/14  2:30 PM      Result Value Ref Range   ABO/RH(D) A POS     Antibody Screen NEG     Sample Expiration 02/01/2014    ABO/RH     Status: None   Collection Time    01/29/14  2:30 PM      Result Value Ref Range   ABO/RH(D) A POS      Assessment:  IOL for IUGR and elevated BP per MFM's recommendation Anxiety - has not required prn Vistaril  GBS neg Stable vitals - pre-e w/u normal  Plan: D/W pt and spouse other forms of induction to include transcervical foley bulb and Pitocin.  Reassess at noon. If cervix 2 cms or greater, will only start Pitocin. If still 1 cm, will place foley bulb and add Pitocin when 2 cms or greater. Pt and spouse agreeable with plan. NPO except for ice chips for now. Discuss plan w/ Dr. Stefano GaulStringer.    Sherre ScarletWILLIAMS, KIMBERLY CNM 01/30/2014, 8:28 AM

## 2014-01-30 NOTE — Progress Notes (Signed)
Pt all over the bed trying to get comfotable, high Fowlers, Right lateral, SF, Low Fowlers.  Unable to get in comfortable position

## 2014-01-30 NOTE — Progress Notes (Signed)
  Subjective: Feeling some pain/pressure. Spouse at bedside.  Objective: BP 122/75  Pulse 110  Temp(Src) 98.1 F (36.7 C) (Oral)  Resp 18  Ht 5\' 4"  (1.626 m)  Wt 232 lb (105.235 kg)  BMI 39.80 kg/m2  SpO2 98%  LMP 05/01/2013    FHT: Category 2 (mild variables, moderate variability) UC:   irregular, every 2-3 minutes SVE: C/C/+1   Pitocin at 4 mius/min Foley cath removed - blood tinged urine noted in bag MVUs 180  Assessment:  2nd stage labor Trial push ineffective Blood tinged urine likely from head descending   Plan: Allow passive descent Expect SVD     Sherre ScarletWILLIAMS, Omer Monter CNM 01/30/2014, 6:35 PM

## 2014-01-30 NOTE — Progress Notes (Signed)
Pt uncomfortable to lay on her side, encouraged to try different positions

## 2014-01-30 NOTE — Anesthesia Procedure Notes (Signed)
Epidural Patient location during procedure: OB Start time: 01/30/2014 11:30 AM End time: 01/30/2014 11:34 AM  Staffing Anesthesiologist: Leilani AbleHATCHETT, FRANKLIN Performed by: anesthesiologist   Preanesthetic Checklist Completed: patient identified, surgical consent, pre-op evaluation, timeout performed, IV checked, risks and benefits discussed and monitors and equipment checked  Epidural Patient position: sitting Prep: site prepped and draped and DuraPrep Patient monitoring: continuous pulse ox and blood pressure Approach: midline Location: L3-L4 Injection technique: LOR air  Needle:  Needle type: Tuohy  Needle gauge: 17 G Needle length: 9 cm and 9 Needle insertion depth: 9 cm Catheter type: closed end flexible Catheter size: 19 Gauge Catheter at skin depth: 15 cm Test dose: negative and Other  Assessment Sensory level: T9 Events: blood not aspirated, injection not painful, no injection resistance, negative IV test and no paresthesia  Additional Notes Reason for block:procedure for pain

## 2014-01-30 NOTE — Anesthesia Preprocedure Evaluation (Signed)
Anesthesia Evaluation  Patient identified by MRN, date of birth, ID band Patient awake    Reviewed: Allergy & Precautions, H&P , NPO status , Patient's Chart, lab work & pertinent test results  Airway Mallampati: III TM Distance: >3 FB Neck ROM: full    Dental no notable dental hx.    Pulmonary former smoker,    Pulmonary exam normal       Cardiovascular negative cardio ROS      Neuro/Psych negative neurological ROS  negative psych ROS   GI/Hepatic negative GI ROS, Neg liver ROS,   Endo/Other  Morbid obesity  Renal/GU negative Renal ROS     Musculoskeletal   Abdominal Normal abdominal exam  (+)   Peds  Hematology negative hematology ROS (+)   Anesthesia Other Findings   Reproductive/Obstetrics (+) Pregnancy                           Anesthesia Physical Anesthesia Plan  ASA: III  Anesthesia Plan: Epidural   Post-op Pain Management:    Induction:   Airway Management Planned:   Additional Equipment:   Intra-op Plan:   Post-operative Plan:   Informed Consent: I have reviewed the patients History and Physical, chart, labs and discussed the procedure including the risks, benefits and alternatives for the proposed anesthesia with the patient or authorized representative who has indicated his/her understanding and acceptance.     Plan Discussed with:   Anesthesia Plan Comments:         Anesthesia Quick Evaluation

## 2014-01-30 NOTE — Progress Notes (Addendum)
  Subjective: Pt sleeping upon entering room.  Pt reports increased cramping.  Family at bedside and supportive.  Objective: BP 112/61  Pulse 77  Temp(Src) 98.9 F (37.2 C) (Oral)  Resp 16  Ht 5\' 4"  (1.626 m)  Wt 232 lb (105.235 kg)  BMI 39.80 kg/m2  LMP 05/01/2013      FHT: Category I UC:   occasional  SVE:   Dilation: 1 Effacement (%): 50 Station: -2 Exam by:: Oxley, RNCNM    Induction of labor due to IUGR,  4th dose of Cytotec placed at 0307  Labor: Progressing normally, plan to start pitocin in the morning Preeclampsia: BP WNL Fetal Wellbeing: Category I  Pain Control: Labor support without medications  I/D: GBS neg; Intact; Afebrile   Anticipated MOD: NSVD    OXLEY, JENNIFER 11/07/2013, 7:23 AM

## 2014-01-30 NOTE — Progress Notes (Signed)
  Subjective: Pt reports continued cramping and bloody show.  Family at bedside and supportive.  Objective: BP 134/54  Pulse 111  Temp(Src) 98.2 F (36.8 C) (Oral)  Resp 16  Ht 5\' 4"  (1.626 m)  Wt 232 lb (105.235 kg)  BMI 39.80 kg/m2  LMP 05/01/2013      FHT: Category I UC:   none  SVE:   Dilation: 1 Effacement (%): 50 Station: -2 Exam by:: Oxley, RN   Induction of labor due to IUGR,  5th dose of cytotec placed  Labor: Progressing normally  Preeclampsia: BP 134/54  Fetal Wellbeing: Category I  Pain Control: Labor support without medications  I/D: GBS neg; Intact; Afebrile  Anticipated MOD: NSVD    OXLEY, JENNIFER 11/07/2013, 7:23 AM

## 2014-01-30 NOTE — Progress Notes (Signed)
  Subjective: Having some right sided hip pain, o/w comfortable w/ epidural  Objective: BP 113/65  Pulse 118  Temp(Src) 98.7 F (37.1 C) (Oral)  Resp 18  Ht 5\' 4"  (1.626 m)  Wt 232 lb (105.235 kg)  BMI 39.80 kg/m2  SpO2 98%  LMP 05/01/2013    FHT: Category 1 UC:   Unable to adequately trace via toco SVE:  3-4/90/-1 Small amount of bloody show IUPC placed Pitocin at 2 mius/min  Assessment:  Early labor SROM GBS neg  Plan: Continue current plan Consult prn Expect progress  Sherre ScarletWILLIAMS, KIMBERLY CNM 01/30/2014, 2:20 PM

## 2014-01-31 ENCOUNTER — Encounter (HOSPITAL_COMMUNITY): Payer: Self-pay | Admitting: *Deleted

## 2014-01-31 LAB — CBC
HCT: 31.3 % — ABNORMAL LOW (ref 36.0–46.0)
HEMOGLOBIN: 10.4 g/dL — AB (ref 12.0–15.0)
MCH: 29.5 pg (ref 26.0–34.0)
MCHC: 33.2 g/dL (ref 30.0–36.0)
MCV: 88.7 fL (ref 78.0–100.0)
Platelets: 168 10*3/uL (ref 150–400)
RBC: 3.53 MIL/uL — ABNORMAL LOW (ref 3.87–5.11)
RDW: 13.5 % (ref 11.5–15.5)
WBC: 13.8 10*3/uL — AB (ref 4.0–10.5)

## 2014-01-31 NOTE — Lactation Note (Signed)
This note was copied from the chart of Cheryl [name]. Lactation Consultation Note  Mother has baby latched in fb position upon entering the room.  Assisted with achieving more depth. Sucks and some swallows observed with breast massage. LS7. SGA. P1.  Set up DEBP.  Reviewed cleaning, milk storage, foley cup, spoon feeding and syringe. Plan is for mother to massage both breasts and hand express. Postpump 15-20 min both breasts during waking hours 4-6 times a day. Give baby back pumped milk following volume guidelines. Mom encouraged to feed baby 8-12 times/24 hours and with feeding cues.  Keep feedings as close to 30 min as possible. Wake baby if he goes for more than 3-31/2 hours without feeding. Encouraged lots of STS. Mom made aware of O/P services, breastfeeding support groups, community resources, and our phone # for post-discharge questions.    Patient Name: Cheryl [name] ZOXWR'UToday's Date: 01/31/2014 Reason for consult: Initial assessment   Maternal Data Has patient been taught Hand Expression?: Yes  Feeding Feeding Type: Breast Fed Length of feed: 10 min  LATCH Score/Interventions Latch: Repeated attempts needed to sustain latch, nipple held in mouth throughout feeding, stimulation needed to elicit sucking reflex. Intervention(s): Adjust position;Assist with latch;Breast massage;Breast compression  Audible Swallowing: A few with stimulation Intervention(s): Hand expression;Alternate breast massage;Skin to skin  Type of Nipple: Everted at rest and after stimulation  Comfort (Breast/Nipple): Soft / non-tender     Hold (Positioning): Assistance needed to correctly position infant at breast and maintain latch.  LATCH Score: 7  Lactation Tools Discussed/Used Tools: Pump;Feeding cup Breast pump type: Double-Electric Breast Pump Pump Review: Setup, frequency, and cleaning;Milk Storage Initiated by:: Dahlia Byesuth Lise Pincus RN Date initiated:: 01/31/14   Consult  Status Consult Status: Follow-up Date: 02/01/14 Follow-up type: In-patient    Dahlia ByesBerkelhammer, Deaven [facility] 01/31/2014, 9:12 PM

## 2014-01-31 NOTE — Anesthesia Postprocedure Evaluation (Signed)
Anesthesia Post Note  Patient: Cheryl ClinesChelsea D Brooking  Procedure(s) Performed: * No procedures listed *  Anesthesia type: Epidural  Patient location: Mother/Baby  Post pain: Pain level controlled  Post assessment: Post-op Vital signs reviewed  Last Vitals:  Filed Vitals:   01/31/14 0220  BP: 111/68  Pulse: 96  Temp: 36.6 C  Resp: 18    Post vital signs: Reviewed  Level of consciousness:alert  Complications: No apparent anesthesia complications

## 2014-01-31 NOTE — Progress Notes (Signed)
Post Partum Day 1  Subjective:  no complaints, voiding and tolerating PO  Objective: Blood pressure 111/68, pulse 96, temperature 97.9 F (36.6 C), temperature source Oral, resp. rate 18, height 5\' 4"  (1.626 m), weight 232 lb (105.235 kg), last menstrual period 05/01/2013, SpO2 98.00%, unknown if currently breastfeeding.  Physical Exam:  General: no distress Lochia: appropriate Uterine Fundus: firm Incision: NA DVT Evaluation: No evidence of DVT seen on physical exam.   Recent Labs  01/30/14 1024 01/31/14 0648  HGB 12.4 10.4*  HCT 36.4 31.3*    Assessment/Plan:  Plan for discharge tomorrow, Breastfeeding and Circumcision prior to discharge   LOS: 2 days   STRINGER,ARTHUR V 01/31/2014, 9:36 AM

## 2014-02-01 MED ORDER — IBUPROFEN 600 MG PO TABS
600.0000 mg | ORAL_TABLET | Freq: Four times a day (QID) | ORAL | Status: DC
Start: 2014-02-01 — End: 2016-10-18

## 2014-02-01 NOTE — Discharge Instructions (Signed)
Vaginal Delivery °Care After °Refer to this sheet in the next few weeks. These discharge instructions provide you with information on caring for yourself after delivery. Your caregiver may also give you specific instructions. Your treatment has been planned according to the most current medical practices available, but problems sometimes occur. Call your caregiver if you have any problems or questions after you go home. °HOME CARE INSTRUCTIONS °· Take over-the-counter or prescription medicines only as directed by your caregiver or pharmacist. °· Do not drink alcohol, especially if you are breastfeeding or taking medicine to relieve pain. °· Do not chew or smoke tobacco. °· Do not use illegal drugs. °· Continue to use good perineal care. Good perineal care includes: °¨ Wiping your perineum from front to back. °¨ Keeping your perineum clean. °· Do not use tampons or douche until your caregiver says it is okay. °· Shower, wash your hair, and take tub baths as directed by your caregiver. °· Wear a well-fitting bra that provides breast support. °· Eat healthy foods. °· Drink enough fluids to keep your urine clear or pale yellow. °· Eat high-fiber foods such as whole grain cereals and breads, brown rice, beans, and fresh fruits and vegetables every day. These foods may help prevent or relieve constipation. °· Follow your cargiver's recommendations regarding resumption of activities such as climbing stairs, driving, lifting, exercising, or traveling. °· Talk to your caregiver about resuming sexual activities. Resumption of sexual activities is dependent upon your risk of infection, your rate of healing, and your comfort and desire to resume sexual activity. °· Try to have someone help you with your household activities and your newborn for at least a few days after you leave the hospital. °· Rest as much as possible. Try to rest or take a nap when your newborn is sleeping. °· Increase your activities gradually. °· Keep all  of your scheduled postpartum appointments. It is very important to keep your scheduled follow-up appointments. At these appointments, your caregiver will be checking to make sure that you are healing physically and emotionally. °SEEK MEDICAL CARE IF:  °· You are passing large clots from your vagina. Save any clots to show your caregiver. °· You have a foul smelling discharge from your vagina. °· You have trouble urinating. °· You are urinating frequently. °· You have pain when you urinate. °· You have a change in your bowel movements. °· You have increasing redness, pain, or swelling near your vaginal incision (episiotomy) or vaginal tear. °· You have pus draining from your episiotomy or vaginal tear. °· Your episiotomy or vaginal tear is separating. °· You have painful, hard, or reddened breasts. °· You have a severe headache. °· You have blurred vision or see spots. °· You feel sad or depressed. °· You have thoughts of hurting yourself or your newborn. °· You have questions about your care, the care of your newborn, or medicines. °· You are dizzy or lightheaded. °· You have a rash. °· You have nausea or vomiting. °· You were breastfeeding and have not had a menstrual period within 12 weeks after you stopped breastfeeding. °· You are not breastfeeding and have not had a menstrual period by the 12th week after delivery. °· You have a fever. °SEEK IMMEDIATE MEDICAL CARE IF:  °· You have persistent pain. °· You have chest pain. °· You have shortness of breath. °· You faint. °· You have leg pain. °· You have stomach pain. °· Your vaginal bleeding saturates two or more sanitary pads   in 1 hour. °MAKE SURE YOU:  °· Understand these instructions. °· Will watch your condition. °· Will get help right away if you are not doing well or get worse. ° ° °Document Released: 07/20/2000 Document Revised: 04/16/2012 Document Reviewed: 03/19/2012 °Exit[employer]® Patient Information ©2015 [employer]. This information is not intended to  replace advice given to you by your health care provider. Make sure you discuss any questions you have with your health care provider. ° °

## 2014-02-01 NOTE — Discharge Summary (Signed)
Obstetric Discharge Summary Reason for Admission: induction of labor Prenatal Procedures: ultrasound and Weekly BPP and dopplers Intrapartum Procedures: spontaneous vaginal delivery Postpartum Procedures: none Complications-Operative and Postpartum: 2nd degree perineal laceration Hemoglobin  Date Value Ref Range Status  01/31/2014 10.4* 12.0 - 15.0 g/dL Final     HCT  Date Value Ref Range Status  01/31/2014 31.3* 36.0 - 46.0 % Final    Physical Exam:  General: alert, cooperative and no distress Lochia: appropriate Uterine Fundus: firm Incision: n/a DVT Evaluation: No evidence of DVT seen on physical exam.  Discharge Diagnoses: Term Pregnancy-delivered  Discharge Information: Date: 02/01/2014 Activity: pelvic rest Diet: routine Medications: PNV and Ibuprofen Condition: stable Instructions: See discharge instructions. Discharge to: home Follow-up Information   Follow up with Geryl RankinsVARNADO, Antario Yasuda, MD. Schedule an appointment as soon as possible for a visit in 6 weeks. (Postpartum check)    Specialty:  Obstetrics and Gynecology   Contact information:   [address] [place] [address] [phone]       Newborn Data: Live born female  Birth Weight: 5 lb 11.2 oz (2586 g) APGAR: 8, 8  Home with mother. Circumcision done 01/31/2014.  Geryl RankinsVARNADO, Sahara Fujimoto 02/01/2014, 7:36 AM

## 2014-02-02 ENCOUNTER — Inpatient Hospital Stay (HOSPITAL_COMMUNITY)
Admission: RE | Admit: 2014-02-02 | Discharge: 2014-02-02 | Disposition: A | Discharge status: Home / Self Care | Payer: BC Managed Care – PPO | Source: Ambulatory Visit | Attending: Obstetrics and Gynecology | Admitting: Obstetrics and Gynecology

## 2014-02-02 LAB — RUBELLA SCREEN: Rubella: 2.86 Index — ABNORMAL HIGH (ref <0.90)

## 2014-06-07 ENCOUNTER — Encounter (HOSPITAL_COMMUNITY): Payer: Self-pay | Admitting: *Deleted

## 2014-06-11 ENCOUNTER — Other Ambulatory Visit: Payer: Self-pay | Admitting: Family Medicine

## 2014-06-11 DIAGNOSIS — M542 Cervicalgia: Secondary | ICD-10-CM

## 2014-06-11 DIAGNOSIS — G935 Compression of brain: Secondary | ICD-10-CM

## 2014-06-23 ENCOUNTER — Ambulatory Visit
Admission: RE | Admit: 2014-06-23 | Discharge: 2014-06-23 | Disposition: A | Discharge status: Home / Self Care | Payer: BC Managed Care – PPO | Source: Ambulatory Visit | Attending: Family Medicine | Admitting: Family Medicine

## 2014-06-23 DIAGNOSIS — M542 Cervicalgia: Secondary | ICD-10-CM

## 2014-06-23 DIAGNOSIS — G935 Compression of brain: Secondary | ICD-10-CM

## 2015-09-05 ENCOUNTER — Encounter: Payer: Self-pay | Admitting: Skilled Nursing Facility1

## 2015-09-05 ENCOUNTER — Encounter: Payer: BLUE CROSS/BLUE SHIELD | Attending: Obstetrics and Gynecology | Admitting: Skilled Nursing Facility1

## 2015-09-05 VITALS — Ht 64.0 in

## 2015-09-05 DIAGNOSIS — Z713 Dietary counseling and surveillance: Secondary | ICD-10-CM | POA: Insufficient documentation

## 2015-09-05 DIAGNOSIS — E669 Obesity, unspecified: Secondary | ICD-10-CM

## 2015-09-05 NOTE — Progress Notes (Signed)
  Medical Nutrition Therapy:  Appt start time: 0930 end time:  1000.   Assessment:  Primary concerns today: referred for obesity. Pt states she has been gaining wt since she gave birth to her son. Pt states she is a picky eater. Pts diet hx: exercising for 2 hours every day and changing diet. Pt states her usual wt was 190 pounds. Pt states she has insomnia, gets about 5 hours of sleep without rest. Pt states she pees about 2 times in the middle of night but does not complain of extreme thirst. Pt states she eats on the couch in front of the television. Pt states she lives with her husband and 26 year old son splitting responsabilities between herself and husband. Pt states she has vomiting and severe diarrhea with gluten containing products. Pt states her stomach hurts in the morning due to anxiety so she cannot eat in the morning. Pt states her husband is working with her to get her to eat more variety of foods. Pt states she is going to start being physically active 2 times a week.  Preferred Learning Style:   No preference indicated   Learning Readiness:   Change in progress   MEDICATIONS: See List   DIETARY INTAKE:  Usual eating pattern includes 1 meals and 0 snacks per day.  Everyday foods include chicken.  Avoided foods include gluten foods, eggs, red meat, yogurt, tomatoes, peanut butter.    24-hr recall:  B ( AM): none Snk ( AM): none L ( PM): none Snk ( PM): none D ( PM): chicken, vegetable, cottage cheese Snk ( PM): none Beverages: water, lacroix carbonated water  Usual physical activity: ADL's  Estimated energy needs: 1600 calories 180 g carbohydrates 120 g protein 44 g fat  Progress Towards Goal(s):  In progress.   Nutritional Diagnosis:  NB-1.1 Food and nutrition-related knowledge deficit As related to no prior nutrition education from a nutrition professional.  As evidenced by pt report, one meal consumed in a day-24 hr recall.    Intervention:  Nutrition  counseling for obesity. Dietitian educated the pt on the benefits of a balanced diet, eating throughout the day, and avoiding gluten containing foods/foods causing diarrhea such as fast food.  Teaching Method Utilized:  Visual Auditory  Handouts given during visit include:  Snack sheet  Vegetable protein options  Barriers to learning/adherence to lifestyle change: picky eating behaviors   Demonstrated degree of understanding via:  Teach Back   Monitoring/Evaluation:  Dietary intake, exercise, symptom management, and body weight prn.

## 2016-02-10 ENCOUNTER — Emergency Department (HOSPITAL_BASED_OUTPATIENT_CLINIC_OR_DEPARTMENT_OTHER)
Admission: EM | Admit: 2016-02-10 | Discharge: 2016-02-10 | Disposition: A | Discharge status: Home / Self Care | Payer: BLUE CROSS/BLUE SHIELD | Attending: Emergency Medicine | Admitting: Emergency Medicine

## 2016-02-10 ENCOUNTER — Encounter (HOSPITAL_BASED_OUTPATIENT_CLINIC_OR_DEPARTMENT_OTHER): Payer: Self-pay | Admitting: Emergency Medicine

## 2016-02-10 DIAGNOSIS — Z79899 Other long term (current) drug therapy: Secondary | ICD-10-CM | POA: Insufficient documentation

## 2016-02-10 DIAGNOSIS — S61031A Puncture wound without foreign body of right thumb without damage to nail, initial encounter: Secondary | ICD-10-CM | POA: Insufficient documentation

## 2016-02-10 DIAGNOSIS — Z87891 Personal history of nicotine dependence: Secondary | ICD-10-CM | POA: Insufficient documentation

## 2016-02-10 DIAGNOSIS — W5501XA Bitten by cat, initial encounter: Secondary | ICD-10-CM | POA: Insufficient documentation

## 2016-02-10 DIAGNOSIS — S60812A Abrasion of left wrist, initial encounter: Secondary | ICD-10-CM | POA: Diagnosis not present

## 2016-02-10 DIAGNOSIS — Y999 Unspecified external cause status: Secondary | ICD-10-CM | POA: Diagnosis not present

## 2016-02-10 DIAGNOSIS — Y929 Unspecified place or not applicable: Secondary | ICD-10-CM | POA: Insufficient documentation

## 2016-02-10 DIAGNOSIS — Y939 Activity, unspecified: Secondary | ICD-10-CM | POA: Insufficient documentation

## 2016-02-10 DIAGNOSIS — S61051A Open bite of right thumb without damage to nail, initial encounter: Secondary | ICD-10-CM | POA: Diagnosis present

## 2016-02-10 DIAGNOSIS — Z203 Contact with and (suspected) exposure to rabies: Secondary | ICD-10-CM

## 2016-02-10 MED ORDER — RABIES IMMUNE GLOBULIN 150 UNIT/ML IM INJ
20.0000 [IU]/kg | INJECTION | Freq: Once | INTRAMUSCULAR | Status: AC
  Administered 2016-02-10: 2025 [IU] via INTRAMUSCULAR
  Filled 2016-02-10: qty 8

## 2016-02-10 MED ORDER — AMOXICILLIN-POT CLAVULANATE 875-125 MG PO TABS: 1.0000 | ORAL_TABLET | Freq: Two times a day (BID) | ORAL | Status: DC

## 2016-02-10 MED ORDER — RABIES IMMUNE GLOBULIN 150 UNIT/ML IM INJ
INJECTION | INTRAMUSCULAR | Status: AC
  Administered 2016-02-10: 2025 [IU] via INTRAMUSCULAR
  Filled 2016-02-10: qty 6

## 2016-02-10 MED ORDER — RABIES VACCINE, PCEC IM SUSR
1.0000 mL | Freq: Once | INTRAMUSCULAR | Status: AC
  Administered 2016-02-10: 1 mL via INTRAMUSCULAR
  Filled 2016-02-10: qty 1

## 2016-02-10 MED FILL — AMOX-CLAV 875-125 MG TABLET: 875-125 | 5 days supply | Qty: 10 | Fill #0

## 2016-02-10 NOTE — ED Notes (Signed)
Pt made aware to return if symptoms worsen or if any life threatening symptoms occur.   

## 2016-02-10 NOTE — Discharge Instructions (Signed)
[facility]     [address]                                             [place], [place] [address]              [phone]                                  INSTRUCTIONS FOR THE PATIENT  Patient's Name: Cheryl GoodieChelsea D Ruis                     Original Order Date:  02/10/2016  Medical Record Number: [id number]  ED Physician: . Primary Diagnosis: Rabies Exposure       PCP: Laurell JosephsMorrow, Aaron P, MD . RABIES VACCINE:  Patient Phone Number: (home) [phone] (home)    (cell)  No relevant phone numbers on file.    (work) There is no work phone number on file. Species of Animal: cats (1) IMMUNOGLOBULIN INJECTION GIVEN IN THE ER?: yes   You have been seen in the Emergency Department for a possible rabies exposure.  You must return for the additional vaccine doses to the Urgent Care Center ([address]) [facility].  If needed, your immunoglobulin follow-up injections, need to be scheduled for the dates below. If your first visit should fall on a weekend day, please come anytime between the hours of 9am-7pm.  DAY 0:  Date 02-10-16     To: Urgent Care  DAY 3:  Date 02-13-16     To:  Urgent Care  DAY 7:  Date 02-17-16     To:  Urgent Care  DAY 14:  Date 02-24-16   To:  Urgent Care  The 5th vaccine injection is considered for immune compromised patients only.  The Urgent Care Center is open from 8am-8pm Monday thru Friday and 9am-7pm on Saturdays and Sundays.  There will be a minimal fee for the injection that will be billed to your insurance company along with the charge for the vaccine.                                                                                                Date: 02/10/2016  Patient Signature: _________________________________________________  [facility]   Patient Copy      Pharmacy Copy

## 2016-02-10 NOTE — ED Notes (Signed)
Pt given rabies vaccine information sheet. Pt has consented verbally to rabies vaccine tx today in ED.

## 2016-02-10 NOTE — ED Provider Notes (Signed)
CSN: [id number]     Arrival date & time 02/10/16  [id number] History   First MD Initiated Contact with Patient 02/10/16 1007     Chief Complaint  Patient presents with  . Animal Bite   (Consider location/radiation/quality/duration/timing/severity/associated sxs/prior Treatment) HPI 25 y.o. female presents to the Emergency Department today due to cat bite around 0900. Pt states that she heard a "meow" under the hood of a car. Proceeded to reach under the vehicle and the kitten (approx. 695-407 weeks old) bit her on the right thumb as well as scratch her left wrist. Bleeding controlled. Kitten did not have identification. Tetanus UTD. No N/V/D. No fevers. No CP/SOB/ABD pain. No pain currently over wound. No other symptoms noted.   Past Medical History  Diagnosis Date  . Medical history non-contributory    Past Surgical History  Procedure Laterality Date  . Tonsillectomy     Family History  Problem Relation Age of Onset  . Hypertension Mother   . Depression Mother   . Parkinson's disease Mother   . Diabetes Father   . Hypertension Father   . Diabetes Maternal Grandmother   . Cancer Maternal Grandmother   . Hyperlipidemia Maternal Grandmother   . Hyperlipidemia Paternal Grandmother   . Diabetes Paternal Grandmother   . Parkinson's disease Paternal Grandmother   . Diabetes Paternal Grandfather   . Hyperlipidemia Paternal Grandfather    Social History  Substance Use Topics  . Smoking status: Former Games developermoker  . Smokeless tobacco: Never Used  . Alcohol Use: No   OB History    Gravida Para Term Preterm AB TAB SAB Ectopic Multiple Living   1 1 1  0 0 0 0 0 0 1     Review of Systems ROS reviewed and all are negative for acute change except as noted in the HPI.  Allergies  Erythromycin; Oxycontin; Vicodin; and Mold extract  Home Medications   Prior to Admission medications   Medication Sig Start Date End Date Taking? Authorizing Provider  ibuprofen (ADVIL,MOTRIN) 600 MG tablet Take 1  tablet (600 mg total) by mouth every 6 (six) hours. 02/01/14  Yes Geryl RankinsEvelyn [name], MD  acetaminophen (TYLENOL) 325 MG tablet Take 325 mg by mouth every 6 (six) hours as needed for headache.    Historical Provider, MD  busPIRone (BUSPAR) 10 MG tablet Take 25 mg by mouth 3 (three) times daily.    Historical Provider, MD  calcium carbonate (TUMS - DOSED IN MG ELEMENTAL CALCIUM) 500 MG chewable tablet Chew 2 tablets by mouth daily as needed for indigestion or heartburn.    Historical Provider, MD  cetirizine (ZYRTEC) 5 MG tablet Take 5 mg by mouth daily.    Historical Provider, MD  clonazePAM (KLONOPIN) 0.5 MG tablet Take 0.5 mg by mouth 2 (two) times daily as needed for anxiety.    Historical Provider, MD  diphenhydrAMINE (BENADRYL) 25 MG tablet Take 25 mg by mouth at bedtime as needed for sleep.    Historical Provider, MD  Prenatal Vit-Fe Fumarate-FA (PRENATAL MULTIVITAMIN) TABS tablet Take 1 tablet by mouth daily at 12 noon. Reported on 09/05/2015    Historical Provider, MD   BP 132/86 mmHg  Pulse 119  Temp(Src) 99 F (37.2 C) (Oral)  Resp 18  Ht 5\' 5"  (1.651 m)  Wt 102.059 kg  BMI 37.44 kg/m2  SpO2 99%  LMP 01/28/2016   Physical Exam  Constitutional: She is oriented to person, place, and time. She appears well-developed and well-nourished.  HENT:  Head: Normocephalic and  atraumatic.  Eyes: EOM are normal. Pupils are equal, round, and reactive to light.  Neck: Normal range of motion. Neck supple.  Cardiovascular: Normal rate and regular rhythm.   Pulmonary/Chest: Effort normal.  Abdominal: Soft.  Musculoskeletal: Normal range of motion.  Small puncture marks noted on right posterior thumb along PIP. Superficial scratches noted on left anterior wrist. Bleeding controlled. No erythema. No signs of infection.   Neurological: She is alert and oriented to person, place, and time.  Skin: Skin is warm and dry.  Psychiatric: She has a normal mood and affect. Her behavior is normal. Thought  content normal.  Nursing note and vitals reviewed.  ED Course  Procedures (including critical care time) Labs Review Labs Reviewed - No data to display  Imaging Review No results found. I have personally reviewed and evaluated these images and lab results as part of my medical decision-making.   EKG Interpretation None      MDM  I have reviewed the relevant previous healthcare records. I obtained HPI from historian. Patient discussed with supervising physician  ED Course:  Assessment: Pt is a 25yF who presents with cat bite since 0900. Rabies vaccine and immunoglobulin initiated. On exam, pt in NAD. Nontoxic/nonseptic appearing. VSS. Afebrile. Superficial lacerations noted on left anterior wrist. Puncture marks on right dorsal thumb. Plan is to DC home with rabies protocol on DC instructions. Given Rx Augmentin. At time of discharge, Patient is in no acute distress. Vital Signs are stable. Patient is able to ambulate. Patient able to tolerate PO.    Disposition/Plan:  DC Home Additional Verbal discharge instructions given and discussed with patient.  Pt Instructed to f/u with PCP in the next week for evaluation and treatment of symptoms. Return precautions given Pt acknowledges and agrees with plan  Supervising Physician Pricilla LovelessScott Goldston, MD   Final diagnoses:  Rabies exposure     Audry Piliyler Pressley Barsky, PA-C 02/10/16 1048  Pricilla LovelessScott Goldston, MD 02/11/16 1818

## 2016-02-10 NOTE — ED Notes (Addendum)
Pt reports leaving to go run errands this morning and heard a "meow" and found an unfamiliar kitten appx [phone] weeks old under hood of car.  PT tried to rescue kitten and kitten bit her on right thumb and has scratches to anterior wrists.  No bleeding at this time.  Pt tetanus vaccine within past 5 years. Pt reports she did wash off affected areas with soap and water.

## 2016-02-13 ENCOUNTER — Ambulatory Visit (HOSPITAL_COMMUNITY)
Admission: EM | Admit: 2016-02-13 | Discharge: 2016-02-13 | Disposition: A | Discharge status: Home / Self Care | Payer: BLUE CROSS/BLUE SHIELD | Attending: Emergency Medicine | Admitting: Emergency Medicine

## 2016-02-13 ENCOUNTER — Encounter (HOSPITAL_COMMUNITY): Payer: Self-pay | Admitting: Emergency Medicine

## 2016-02-13 DIAGNOSIS — Z203 Contact with and (suspected) exposure to rabies: Secondary | ICD-10-CM | POA: Diagnosis not present

## 2016-02-13 MED ORDER — RABIES VACCINE, PCEC IM SUSR
INTRAMUSCULAR | Status: AC
  Filled 2016-02-13: qty 1

## 2016-02-13 MED ORDER — RABIES VACCINE, PCEC IM SUSR
1.0000 mL | Freq: Once | INTRAMUSCULAR | Status: AC
  Administered 2016-02-13: 1 mL via INTRAMUSCULAR

## 2016-02-13 NOTE — ED Notes (Signed)
[facility] 2[facility] in [facility] series of rabies vaccinations.

## 2016-02-13 NOTE — Discharge Instructions (Signed)
Please return to the [facility]gent Care [facility] for your 3rd rabies vaccination on 02/17/2016.

## 2016-03-30 ENCOUNTER — Other Ambulatory Visit: Payer: Self-pay | Admitting: Obstetrics and Gynecology

## 2016-03-30 DIAGNOSIS — N939 Abnormal uterine and vaginal bleeding, unspecified: Secondary | ICD-10-CM

## 2016-04-04 ENCOUNTER — Other Ambulatory Visit: Payer: BLUE CROSS/BLUE SHIELD

## 2016-05-30 ENCOUNTER — Emergency Department (HOSPITAL_BASED_OUTPATIENT_CLINIC_OR_DEPARTMENT_OTHER): Payer: BLUE CROSS/BLUE SHIELD

## 2016-05-30 ENCOUNTER — Emergency Department (HOSPITAL_BASED_OUTPATIENT_CLINIC_OR_DEPARTMENT_OTHER)
Admission: EM | Admit: 2016-05-30 | Discharge: 2016-05-30 | Disposition: A | Discharge status: Home / Self Care | Payer: BLUE CROSS/BLUE SHIELD | Attending: Emergency Medicine | Admitting: Emergency Medicine

## 2016-05-30 ENCOUNTER — Encounter (HOSPITAL_BASED_OUTPATIENT_CLINIC_OR_DEPARTMENT_OTHER): Payer: Self-pay | Admitting: Respiratory Therapy

## 2016-05-30 DIAGNOSIS — Z87891 Personal history of nicotine dependence: Secondary | ICD-10-CM | POA: Insufficient documentation

## 2016-05-30 DIAGNOSIS — R0789 Other chest pain: Secondary | ICD-10-CM | POA: Insufficient documentation

## 2016-05-30 DIAGNOSIS — R079 Chest pain, unspecified: Secondary | ICD-10-CM

## 2016-05-30 DIAGNOSIS — M546 Pain in thoracic spine: Secondary | ICD-10-CM | POA: Diagnosis not present

## 2016-05-30 DIAGNOSIS — R05 Cough: Secondary | ICD-10-CM | POA: Diagnosis not present

## 2016-05-30 DIAGNOSIS — Z791 Long term (current) use of non-steroidal anti-inflammatories (NSAID): Secondary | ICD-10-CM | POA: Insufficient documentation

## 2016-05-30 DIAGNOSIS — R059 Cough, unspecified: Secondary | ICD-10-CM

## 2016-05-30 DIAGNOSIS — R0602 Shortness of breath: Secondary | ICD-10-CM | POA: Diagnosis not present

## 2016-05-30 DIAGNOSIS — J029 Acute pharyngitis, unspecified: Secondary | ICD-10-CM | POA: Insufficient documentation

## 2016-05-30 MED ORDER — HYDROCOD POLST-CPM POLST ER 10-8 MG/5ML PO SUER: 5.0000 mL | Freq: Every evening | ORAL | 0 refills | Status: DC | PRN

## 2016-05-30 MED ORDER — IBUPROFEN 400 MG PO TABS
600.0000 mg | ORAL_TABLET | Freq: Once | ORAL | Status: AC
  Administered 2016-05-30: 600 mg via ORAL
  Filled 2016-05-30: qty 1

## 2016-05-30 MED ORDER — GUAIFENESIN 100 MG/5ML PO SOLN
5.0000 mL | Freq: Once | ORAL | Status: AC
  Administered 2016-05-30: 100 mg via ORAL
  Filled 2016-05-30: qty 5

## 2016-05-30 NOTE — ED Provider Notes (Signed)
[facility]   CSN: [id number] Arrival date & time: 05/30/16  0304     History   Chief Complaint Chief Complaint  Patient presents with  . Chest Pain    that worsens with cough    HPI Cheryl [name] is a 25 y.o. female.  The history is provided by the patient.  Chest Pain   This is a new problem. The current episode started less than 1 hour ago. The problem occurs constantly. The pain is associated with coughing, movement and raising an arm. The pain is present in the lateral region. The pain is mild. The quality of the pain is described as sharp. The symptoms are aggravated by certain positions and deep breathing. Associated symptoms include back pain, cough and shortness of breath. Pertinent negatives include no fever, no hemoptysis and no vomiting.  Pertinent negatives for past medical history include no CAD and no PE. Past medical history comments: h/o sinus tachycardia  Patient reports prior to going to bed she had sore throat and cough She woke up several hrs later with worsened cough and right sided CP The CP is worse with palpation, cough/breathing and also arm movement  She is a nonsmoker She is not OCPs No recent travel/surgery  Past Medical History:  Diagnosis Date  . Medical history non-contributory     Patient Active Problem List   Diagnosis Date Noted  . Vaginal delivery 01/31/2014  . Small for gestational age fetus affecting mother, antepartum 01/29/2014    Past Surgical History:  Procedure Laterality Date  . TONSILLECTOMY      OB History    Gravida Para Term Preterm AB Living   1 1 1  0 0 1   SAB TAB Ectopic Multiple Live Births   0 0 0 0 1       Home Medications    Prior to Admission medications   Medication Sig Start Date End Date Taking? Authorizing Provider  acetaminophen (TYLENOL) 325 MG tablet Take 325 mg by mouth every 6 (six) hours as needed for headache.    Historical Provider, MD    amoxicillin-clavulanate (AUGMENTIN) 875-125 MG tablet Take 1 tablet by mouth 2 (two) times daily. 02/10/16   Audry Pili, PA-C  busPIRone (BUSPAR) 10 MG tablet Take 25 mg by mouth 3 (three) times daily.    Historical Provider, MD  calcium carbonate (TUMS - DOSED IN MG ELEMENTAL CALCIUM) 500 MG chewable tablet Chew 2 tablets by mouth daily as needed for indigestion or heartburn.    Historical Provider, MD  cetirizine (ZYRTEC) 5 MG tablet Take 5 mg by mouth daily.    Historical Provider, MD  clonazePAM (KLONOPIN) 0.5 MG tablet Take 0.5 mg by mouth 2 (two) times daily as needed for anxiety.    Historical Provider, MD  diphenhydrAMINE (BENADRYL) 25 MG tablet Take 25 mg by mouth at bedtime as needed for sleep.    Historical Provider, MD  ibuprofen (ADVIL,MOTRIN) 600 MG tablet Take 1 tablet (600 mg total) by mouth every 6 (six) hours. 02/01/14   Geryl Rankins, MD  Prenatal Vit-Fe Fumarate-FA (PRENATAL MULTIVITAMIN) TABS tablet Take 1 tablet by mouth daily at 12 noon. Reported on 09/05/2015    Historical Provider, MD    Family History Family History  Problem Relation Age of Onset  . Diabetes Maternal Grandmother   . Cancer Maternal Grandmother   . Hyperlipidemia Maternal Grandmother   . Hyperlipidemia Paternal Grandmother   . Diabetes Paternal Grandmother   . Parkinson's disease  Paternal Grandmother   . Diabetes Paternal Grandfather   . Hyperlipidemia Paternal Grandfather   . Hypertension Mother   . Depression Mother   . Parkinson's disease Mother   . Diabetes Father   . Hypertension Father     Social History Social History  Substance Use Topics  . Smoking status: Former Smoker    Types: Cigarettes  . Smokeless tobacco: Never Used  . Alcohol use 1.8 oz/week    3 Cans of beer per week     Allergies   Erythromycin; Oxycontin [oxycodone hcl]; Vicodin [hydrocodone-acetaminophen]; and Mold extract [trichophyton]   Review of Systems Review of Systems  Constitutional: Negative for fever.   Respiratory: Positive for cough and shortness of breath. Negative for hemoptysis.   Cardiovascular: Positive for chest pain.  Gastrointestinal: Negative for vomiting.  Musculoskeletal: Positive for back pain.  All other systems reviewed and are negative.    Physical Exam Updated Vital Signs BP 137/91 (BP Location: Right Arm)   Pulse (!) 125   Temp 98.7 F (37.1 C) (Oral)   Resp 20   Ht 5\' 4"  (1.626 m)   Wt 102.1 kg   LMP 05/26/2016   SpO2 100%   BMI 38.62 kg/m   Physical Exam CONSTITUTIONAL: Well developed/well nourished HEAD: Normocephalic/atraumatic EYES: EOMI/PERRL ENMT: Mucous membranes moist, uvula midline, no erythema/exudates NECK: supple no meningeal signs SPINE/BACK:entire spine nontender, right parathoracic tenderness to palpation CV: S1/S2 noted, no murmurs/rubs/gallops noted, tachycardic LUNGS: Lungs are clear to auscultation bilaterally, no apparent distress Chest - tenderness to right chest wall, no bruising/crepitus ABDOMEN: soft, nontender, no rebound or guarding, bowel sounds noted throughout abdomen GU:no cva tenderness NEURO: Pt is awake/alert/appropriate, moves all extremitiesx4.  No facial droop.   EXTREMITIES: pulses normal/equal, full ROM, no calf tenderness SKIN: warm, color normal PSYCH: no abnormalities of mood noted, alert and oriented to situation   ED Treatments / Results  Labs (all labs ordered are listed, but only abnormal results are displayed) Labs Reviewed - No data to display  EKG  EKG Interpretation  Date/Time:  Wednesday May 30 2016 03:11:21 EDT Ventricular Rate:  126 PR Interval:    QRS Duration: 94 QT Interval:  326 QTC Calculation: 472 R Axis:   57 Text Interpretation:  Sinus tachycardia T wave abnormality No significant change since last tracing Confirmed by Bebe ShaggyWICKLINE  MD, Jashley Yellin ([id number]) on 05/30/2016 3:26:49 AM       Radiology Dg Chest 2 View  Result Date: 05/30/2016 CLINICAL DATA:  Cough and shortness of  breath EXAM: CHEST  2 VIEW COMPARISON:  12/18/2013 FINDINGS: The heart size and mediastinal contours are within normal limits. Both lungs are clear. The visualized skeletal structures are unremarkable. IMPRESSION: No active cardiopulmonary disease. Electronically Signed   By: Jasmine PangKim  Fujinaga M.D.   On: 05/30/2016 04:00    Procedures Procedures (including critical care time)  Medications Ordered in ED Medications  guaiFENesin (ROBITUSSIN) 100 MG/5ML solution 100 mg (100 mg Oral Given 05/30/16 0412)  ibuprofen (ADVIL,MOTRIN) tablet 600 mg (600 mg Oral Given 05/30/16 0412)     Initial Impression / Assessment and Plan / ED Course  I have reviewed the triage vital signs and the nursing notes.  Pertinent imaging results that were available during my care of the patient were reviewed by me and considered in my medical decision making (see chart for details).  Clinical Course    4:01 AM Pt with cough/sore throat, now here with CP, worse with palpation She reports long h/o sinus tachycardia No  change in EKG 4:16 AM Pt well appearing No hypoxia Predominant finding is cough and congestion I have low suspicion for acute PE/Dissection/ACS as cause of her CP at this time She is requesting cough medicine - she reports she can take hydrocodone and only causes mild itching without significant reaction.  She is still requesting hydrocodone We discussed strict return precautions BP 116/78   Pulse 109   Temp 98.7 F (37.1 C) (Oral)   Resp 20   Ht 5\' 4"  (1.626 m)   Wt 102.1 kg   LMP 05/26/2016   SpO2 100%   BMI 38.62 kg/m    Final Clinical Impressions(s) / ED Diagnoses   Final diagnoses:  None    New Prescriptions New Prescriptions   No medications on file     Zadie Rhine, MD 05/30/16 [phone]

## 2016-05-30 NOTE — Discharge Instructions (Signed)

## 2016-05-30 NOTE — ED Notes (Signed)
Patient return from X-ray 

## 2016-05-30 NOTE — ED Triage Notes (Signed)
Pt reports awake from sleep with sharp right side chest pain that radiates to back. Also reports recent onset of cough that causes pain to worsen

## 2016-05-30 NOTE — ED Notes (Signed)
Patient transported to X-ray 

## 2016-07-11 DIAGNOSIS — M79641 Pain in right hand: Secondary | ICD-10-CM | POA: Diagnosis not present

## 2016-07-11 DIAGNOSIS — R2231 Localized swelling, mass and lump, right upper limb: Secondary | ICD-10-CM | POA: Diagnosis not present

## 2016-07-11 DIAGNOSIS — M79642 Pain in left hand: Secondary | ICD-10-CM | POA: Diagnosis not present

## 2016-07-12 ENCOUNTER — Other Ambulatory Visit: Payer: Self-pay | Admitting: Orthopedic Surgery

## 2016-07-12 DIAGNOSIS — R2231 Localized swelling, mass and lump, right upper limb: Secondary | ICD-10-CM

## 2016-07-12 DIAGNOSIS — M79641 Pain in right hand: Secondary | ICD-10-CM

## 2016-07-25 ENCOUNTER — Other Ambulatory Visit: Payer: BLUE CROSS/BLUE SHIELD

## 2016-07-27 ENCOUNTER — Ambulatory Visit
Admission: RE | Admit: 2016-07-27 | Discharge: 2016-07-27 | Disposition: A | Discharge status: Home / Self Care | Payer: BLUE CROSS/BLUE SHIELD | Source: Ambulatory Visit | Attending: Orthopedic Surgery | Admitting: Orthopedic Surgery

## 2016-07-27 DIAGNOSIS — R2231 Localized swelling, mass and lump, right upper limb: Secondary | ICD-10-CM

## 2016-07-27 DIAGNOSIS — M79641 Pain in right hand: Secondary | ICD-10-CM

## 2016-07-27 MED ORDER — GADOBENATE DIMEGLUMINE 529 MG/ML IV SOLN
20.0000 mL | Freq: Once | INTRAVENOUS | Status: AC | PRN
  Administered 2016-07-27: 20 mL via INTRAVENOUS

## 2016-07-31 DIAGNOSIS — M79641 Pain in right hand: Secondary | ICD-10-CM | POA: Diagnosis not present

## 2016-07-31 DIAGNOSIS — M79642 Pain in left hand: Secondary | ICD-10-CM | POA: Diagnosis not present

## 2016-08-06 NOTE — L&D Delivery Note (Signed)
Delivery Note At 2227 a viable female was delivered via  (Presentation:VTX. ROA ;  ).  APGAR: 9, 9 ; weight  .  pending Placenta status: , .  Cord:  with the following complications: none  Anesthesia:  none Episiotomy:   Lacerations:  none Suture Repair:  Est. Blood Loss (mL):  200  Mom to postpartum.  Baby to Couplet care / Skin to Skin.  Jamillah Camilo A ClemmonsCNM 06/13/2017, 10:44 PM

## 2016-08-29 DIAGNOSIS — S0501XA Injury of conjunctiva and corneal abrasion without foreign body, right eye, initial encounter: Secondary | ICD-10-CM | POA: Diagnosis not present

## 2016-09-28 DIAGNOSIS — E282 Polycystic ovarian syndrome: Secondary | ICD-10-CM | POA: Diagnosis not present

## 2016-09-28 DIAGNOSIS — R1030 Lower abdominal pain, unspecified: Secondary | ICD-10-CM | POA: Diagnosis not present

## 2016-10-18 ENCOUNTER — Inpatient Hospital Stay (HOSPITAL_COMMUNITY)
Admission: AD | Admit: 2016-10-18 | Discharge: 2016-10-18 | Disposition: A | Discharge status: Home / Self Care | Payer: BLUE CROSS/BLUE SHIELD | Source: Ambulatory Visit | Attending: Obstetrics and Gynecology | Admitting: Obstetrics and Gynecology

## 2016-10-18 ENCOUNTER — Encounter (HOSPITAL_COMMUNITY): Payer: Self-pay | Admitting: *Deleted

## 2016-10-18 ENCOUNTER — Other Ambulatory Visit (HOSPITAL_COMMUNITY): Payer: Self-pay | Admitting: Family Medicine

## 2016-10-18 DIAGNOSIS — R110 Nausea: Secondary | ICD-10-CM | POA: Diagnosis not present

## 2016-10-18 DIAGNOSIS — Z87891 Personal history of nicotine dependence: Secondary | ICD-10-CM | POA: Diagnosis not present

## 2016-10-18 DIAGNOSIS — O210 Mild hyperemesis gravidarum: Secondary | ICD-10-CM | POA: Insufficient documentation

## 2016-10-18 DIAGNOSIS — Z3A01 Less than 8 weeks gestation of pregnancy: Secondary | ICD-10-CM | POA: Insufficient documentation

## 2016-10-18 DIAGNOSIS — R109 Unspecified abdominal pain: Secondary | ICD-10-CM

## 2016-10-18 DIAGNOSIS — R1011 Right upper quadrant pain: Secondary | ICD-10-CM | POA: Insufficient documentation

## 2016-10-18 DIAGNOSIS — O219 Vomiting of pregnancy, unspecified: Secondary | ICD-10-CM | POA: Diagnosis not present

## 2016-10-18 DIAGNOSIS — Z331 Pregnant state, incidental: Secondary | ICD-10-CM | POA: Diagnosis not present

## 2016-10-18 HISTORY — DX: Disease of gallbladder, unspecified: K82.9

## 2016-10-18 LAB — COMPREHENSIVE METABOLIC PANEL
ALBUMIN: 4.3 g/dL (ref 3.5–5.0)
ALT: 28 U/L (ref 14–54)
ANION GAP: 9 (ref 5–15)
AST: 22 U/L (ref 15–41)
Alkaline Phosphatase: 44 U/L (ref 38–126)
BILIRUBIN TOTAL: 1 mg/dL (ref 0.3–1.2)
BUN: 8 mg/dL (ref 6–20)
CALCIUM: 9.1 mg/dL (ref 8.9–10.3)
CO2: 21 mmol/L — AB (ref 22–32)
Chloride: 105 mmol/L (ref 101–111)
Creatinine, Ser: 0.59 mg/dL (ref 0.44–1.00)
GFR calc Af Amer: >60 mL/min (ref >60)
GFR calc non Af Amer: >60 mL/min (ref >60)
GLUCOSE: 100 mg/dL — AB (ref 65–99)
POTASSIUM: 3.6 mmol/L (ref 3.5–5.1)
Sodium: 135 mmol/L (ref 135–145)
TOTAL PROTEIN: 7 g/dL (ref 6.5–8.1)

## 2016-10-18 LAB — URINALYSIS, ROUTINE W REFLEX MICROSCOPIC
BILIRUBIN URINE: NEGATIVE
Bilirubin Urine: NEGATIVE
Glucose, UA: NEGATIVE mg/dL
Glucose, UA: NEGATIVE mg/dL
HGB URINE DIPSTICK: NEGATIVE
Hgb urine dipstick: NEGATIVE
KETONES UR: 20 mg/dL — AB
KETONES UR: 20 mg/dL — AB
NITRITE: NEGATIVE
NITRITE: NEGATIVE
PROTEIN: 30 mg/dL — AB
PROTEIN: 30 mg/dL — AB
Specific Gravity, Urine: 1.026 (ref 1.005–1.030)
Specific Gravity, Urine: 1.026 (ref 1.005–1.030)
pH: 5 (ref 5.0–8.0)
pH: 5 (ref 5.0–8.0)

## 2016-10-18 LAB — CBC WITH DIFFERENTIAL/PLATELET
BASOS PCT: 0 %
Basophils Absolute: 0 10*3/uL (ref 0.0–0.1)
EOS ABS: 0.1 10*3/uL (ref 0.0–0.7)
Eosinophils Relative: 1 %
HEMATOCRIT: 38.4 % (ref 36.0–46.0)
HEMOGLOBIN: 13 g/dL (ref 12.0–15.0)
Lymphocytes Relative: 27 %
Lymphs Abs: 2.6 10*3/uL (ref 0.7–4.0)
MCH: 28.8 pg (ref 26.0–34.0)
MCHC: 33.9 g/dL (ref 30.0–36.0)
MCV: 85.1 fL (ref 78.0–100.0)
MONO ABS: 0.6 10*3/uL (ref 0.1–1.0)
Monocytes Relative: 6 %
NEUTROS ABS: 6.4 10*3/uL (ref 1.7–7.7)
Neutrophils Relative %: 66 %
Platelets: 302 10*3/uL (ref 150–400)
RBC: 4.51 MIL/uL (ref 3.87–5.11)
RDW: 13.6 % (ref 11.5–15.5)
WBC: 9.7 10*3/uL (ref 4.0–10.5)

## 2016-10-18 LAB — URINALYSIS, MICROSCOPIC (REFLEX)
RBC / HPF: NONE SEEN RBC/hpf (ref 0–5)
WBC UA: NONE SEEN WBC/hpf (ref 0–5)

## 2016-10-18 LAB — LIPASE, BLOOD: LIPASE: 18 U/L (ref 11–51)

## 2016-10-18 LAB — HCG, QUANTITATIVE, PREGNANCY: hCG, Beta Chain, Quant, S: 2780 m[IU]/mL — ABNORMAL HIGH (ref <5)

## 2016-10-18 LAB — POCT PREGNANCY, URINE: PREG TEST UR: POSITIVE — AB

## 2016-10-18 MED ORDER — PROMETHAZINE HCL 25 MG PO TABS: 25.0000 mg | ORAL_TABLET | Freq: Four times a day (QID) | ORAL | 0 refills | Status: DC | PRN

## 2016-10-18 MED ORDER — PROMETHAZINE HCL 25 MG/ML IJ SOLN
25.0000 mg | Freq: Once | INTRAVENOUS | Status: AC
  Administered 2016-10-18: 25 mg via INTRAVENOUS
  Filled 2016-10-18: qty 1

## 2016-10-18 NOTE — Discharge Instructions (Signed)
Cholecystitis °Cholecystitis is inflammation of the gallbladder. It is often called a gallbladder attack. The gallbladder is a pear-shaped organ that lies beneath the liver on the right side of the body. The gallbladder stores bile, which is a fluid that helps the body to digest fats. If bile builds up in your gallbladder, your gallbladder becomes inflamed. This condition may occur suddenly (be acute). Repeat episodes of acute cholecystitis or prolonged episodes may lead to a long-term (chronic) condition. Cholecystitis is serious and it requires treatment. °What are the causes? °The most common cause of this condition is gallstones. Gallstones can block the tube (duct) that carries bile out of your gallbladder. This causes bile to build up. Other causes of this condition include: °· Damage to the gallbladder due to a decrease in blood flow. °· Infections in the bile ducts. °· Scars or kinks in the bile ducts. °· Tumors in the liver, pancreas, or gallbladder. °What increases the risk? °This condition is more likely to develop in: °· People who have sickle cell disease. °· People who take birth control pills or use estrogen. °· People who have alcoholic liver disease. °· People who have liver cirrhosis. °· People who have their nutrition delivered through a vein (parenteral nutrition). °· People who do not eat or drink (do fasting) for a long period of time. °· People who are obese. °· People who have rapid weight loss. °· People who are pregnant. °· People who have increased triglyceride levels. °· People who have pancreatitis. °What are the signs or symptoms? °Symptoms of this condition include: °· Abdominal pain, especially in the upper right area of the abdomen. °· Abdominal tenderness or bloating. °· Nausea. °· Vomiting. °· Fever. °· Chills. °· Yellowing of the skin and the whites of the eyes (jaundice). °How is this diagnosed? °This condition is diagnosed with a medical history and physical exam. You may also  have other tests, including: °· Imaging tests, such as: °¨ An ultrasound of the gallbladder. °¨ A CT scan of the abdomen. °¨ A gallbladder nuclear scan (HIDA scan). This scan allows your health care provider to see the bile moving from your liver to your gallbladder and to your small intestine. °¨ MRI. °· Blood tests, such as: °¨ A complete blood count, because the white blood cell count may be higher than normal. °¨ Liver function tests, because some levels may be higher than normal with certain types of gallstones. °How is this treated? °Treatment may include: °· Fasting for a certain amount of time. °· IV fluids. °· Medicine to treat pain or vomiting. °· Antibiotic medicine. °· Surgery to remove your gallbladder (cholecystectomy). This may happen immediately or at a later time. °Follow these instructions at home: °Home care will depend on your treatment. In general: °· Take over-the-counter and prescription medicines only as told by your health care provider. °· If you were prescribed an antibiotic medicine, take it as told by your health care provider. Do not stop taking the antibiotic even if you start to feel better. °· Follow instructions from your health care provider about what to eat or drink. When you are allowed to eat, avoid eating or drinking anything that triggers your symptoms. °· Keep all follow-up visits as told by your health care provider. This is important. °Contact a health care provider if: °· Your pain is not controlled with medicine. °· You have a fever. °Get help right away if: °· Your pain moves to another part of your abdomen or to your   back. °· You continue to have symptoms or you develop new symptoms even with treatment. °This information is not intended to replace advice given to you by your health care provider. Make sure you discuss any questions you have with your health care provider. °Document Released: 07/23/2005 Document Revised: 12/01/2015 Document Reviewed:  11/03/2014 °[employer] Interactive Patient Education © 2017 [employer] ° °

## 2016-10-18 NOTE — MAU Provider Note (Signed)
History     CSN: [id number]  Arrival date and time: 10/18/16 1448   First Provider Initiated Contact with Patient 10/18/16 1559      Chief Complaint  Patient presents with  . Abdominal Pain   HPI Cheryl [name] is a 26 y.o. G2P1001 at [redacted]w[redacted]d by LMP who presents for abdominal pain & nausea. Patient was seen by PCP earlier today who wanted an outpatient gall bladder & ob ultrasound but was told by [facility] that she would need to come to [facility] d/t being pregnant; PCP told pt to go to MAU.  Reports RUQ pain x 2 weeks. Pain is intermittent & throbbing/cramping in nature. Rates pain 6/10. Has not treated. Eating makes pain worse; states doesn't matter what she eats. Also endorses nausea & vomiting. Vomited twice today and currently nauseated. Has hx of "gall bladder attack" with previous pregnancy that resolved after delivery & required no follow up. Denies fever/chills, lower abdominal pain, vaginal bleeding, diarrhea, or constipation.   OB History    Gravida Para Term Preterm AB Living   2 1 1  0 0 1   SAB TAB Ectopic Multiple Live Births   0 0 0 0 1      Past Medical History:  Diagnosis Date  . Gallbladder attack     Past Surgical History:  Procedure Laterality Date  . TONSILLECTOMY      Family History  Problem Relation Age of Onset  . Diabetes Maternal Grandmother   . Cancer Maternal Grandmother   . Hyperlipidemia Maternal Grandmother   . Hyperlipidemia Paternal Grandmother   . Diabetes Paternal Grandmother   . Parkinson's disease Paternal Grandmother   . Diabetes Paternal Grandfather   . Hyperlipidemia Paternal Grandfather   . Hypertension Mother   . Depression Mother   . Parkinson's disease Mother   . Diabetes Father   . Hypertension Father     Social History  Substance Use Topics  . Smoking status: Former Smoker    Types: Cigarettes  . Smokeless tobacco: Never Used  . Alcohol use 1.8 oz/week    3 Cans of beer per week     Comment: not while  preg    Allergies:  Allergies  Allergen Reactions  . Erythromycin Itching  . Oxycontin [Oxycodone Hcl] Itching  . Vicodin [Hydrocodone-Acetaminophen] Itching  . Mold Extract [Trichophyton] Rash    Prescriptions Prior to Admission  Medication Sig Dispense Refill Last Dose  . meloxicam (MOBIC) 7.5 MG tablet Take 7.5 mg by mouth.     Marland Kitchen acetaminophen (TYLENOL) 325 MG tablet Take 325 mg by mouth every 6 (six) hours as needed for headache.   Unknown at Unknown time  . busPIRone (BUSPAR) 10 MG tablet Take 25 mg by mouth 3 (three) times daily.   More than a month at Unknown time  . calcium carbonate (TUMS - DOSED IN MG ELEMENTAL CALCIUM) 500 MG chewable tablet Chew 2 tablets by mouth daily as needed for indigestion or heartburn.   More than a month at Unknown time  . cetirizine (ZYRTEC) 5 MG tablet Take 5 mg by mouth daily.   More than a month at Unknown time  . clonazePAM (KLONOPIN) 0.5 MG tablet Take 0.5 mg by mouth 2 (two) times daily as needed for anxiety.   More than a month at Unknown time  . diphenhydrAMINE (BENADRYL) 25 mg capsule Take 25 mg by mouth.     . IRON PO Take by mouth.     . Prenatal Vit-Fe Fumarate-FA (  PRENATAL MULTIVITAMIN) TABS tablet Take 1 tablet by mouth daily at 12 noon. Reported on 09/05/2015   02/13/2016 at Unknown time    Review of Systems  Constitutional: Negative.  Negative for chills and fever.  Gastrointestinal: Positive for abdominal pain, nausea and vomiting. Negative for abdominal distention, constipation and diarrhea.  Genitourinary: Negative.    Physical Exam   Blood pressure (!) 145/85, pulse (!) 119, temperature 97.9 F (36.6 C), resp. rate 18, height 5\' 3"  (1.6 m), weight 249 lb 0.6 oz (113 kg), last menstrual period 09/12/2016, SpO2 100 %, unknown if currently breastfeeding.  Physical Exam  Nursing note and vitals reviewed. Constitutional: She is oriented to person, place, and time. She appears well-developed and well-nourished. No distress.   HENT:  Head: Normocephalic and atraumatic.  Eyes: Conjunctivae are normal. Right eye exhibits no discharge. Left eye exhibits no discharge. No scleral icterus.  Neck: Normal range of motion.  Cardiovascular: Normal rate, regular rhythm and normal heart sounds.   No murmur heard. Respiratory: Effort normal and breath sounds normal. No respiratory distress. She has no wheezes.  GI: Soft. Bowel sounds are normal. She exhibits no distension. There is tenderness. There is positive Murphy's sign. There is no rigidity, no rebound and no guarding.  Neurological: She is alert and oriented to person, place, and time.  Skin: Skin is warm and dry. She is not diaphoretic.  Psychiatric: She has a normal mood and affect. Her behavior is normal. Judgment and thought content normal.    MAU Course  Procedures Results for orders placed or performed during the hospital encounter of 10/18/16 (from the past 24 hour(s))  Urinalysis, Routine w reflex microscopic     Status: Abnormal   Collection Time: 10/18/16  3:30 PM  Result Value Ref Range   Color, Urine YELLOW YELLOW   APPearance CLOUDY (A) CLEAR   Specific Gravity, Urine 1.026 1.005 - 1.030   pH 5.0 5.0 - 8.0   Glucose, UA NEGATIVE NEGATIVE mg/dL   Hgb urine dipstick NEGATIVE NEGATIVE   Bilirubin Urine NEGATIVE NEGATIVE   Ketones, ur 20 (A) NEGATIVE mg/dL   Protein, ur 30 (A) NEGATIVE mg/dL   Nitrite NEGATIVE NEGATIVE   Leukocytes, UA MODERATE (A) NEGATIVE  Urinalysis, Routine w reflex microscopic     Status: Abnormal   Collection Time: 10/18/16  3:30 PM  Result Value Ref Range   Color, Urine YELLOW YELLOW   APPearance CLOUDY (A) CLEAR   Specific Gravity, Urine 1.026 1.005 - 1.030   pH 5.0 5.0 - 8.0   Glucose, UA NEGATIVE NEGATIVE mg/dL   Hgb urine dipstick NEGATIVE NEGATIVE   Bilirubin Urine NEGATIVE NEGATIVE   Ketones, ur 20 (A) NEGATIVE mg/dL   Protein, ur 30 (A) NEGATIVE mg/dL   Nitrite NEGATIVE NEGATIVE   Leukocytes, UA MODERATE (A)  NEGATIVE  Urinalysis, Microscopic (reflex)     Status: Abnormal   Collection Time: 10/18/16  3:30 PM  Result Value Ref Range   RBC / HPF NONE SEEN 0 - 5 RBC/hpf   WBC, UA NONE SEEN 0 - 5 WBC/hpf   Bacteria, UA RARE (A) NONE SEEN   Squamous Epithelial / LPF 0-5 (A) NONE SEEN   Ca Oxalate Crys, UA PRESENT   Pregnancy, urine POC     Status: Abnormal   Collection Time: 10/18/16  3:50 PM  Result Value Ref Range   Preg Test, Ur POSITIVE (A) NEGATIVE  CBC with Differential/Platelet     Status: None   Collection Time: 10/18/16  4:10 PM  Result Value Ref Range   WBC 9.7 4.0 - 10.5 K/uL   RBC 4.51 3.87 - 5.11 MIL/uL   Hemoglobin 13.0 12.0 - 15.0 g/dL   HCT 16.138.4 09.636.0 - 04.546.0 %   MCV 85.1 78.0 - 100.0 fL   MCH 28.8 26.0 - 34.0 pg   MCHC 33.9 30.0 - 36.0 g/dL   RDW 40.913.6 81.111.5 - 91.415.5 %   Platelets 302 150 - 400 K/uL   Neutrophils Relative % 66 %   Neutro Abs 6.4 1.7 - 7.7 K/uL   Lymphocytes Relative 27 %   Lymphs Abs 2.6 0.7 - 4.0 K/uL   Monocytes Relative 6 %   Monocytes Absolute 0.6 0.1 - 1.0 K/uL   Eosinophils Relative 1 %   Eosinophils Absolute 0.1 0.0 - 0.7 K/uL   Basophils Relative 0 %   Basophils Absolute 0.0 0.0 - 0.1 K/uL  Comprehensive metabolic panel     Status: Abnormal   Collection Time: 10/18/16  4:10 PM  Result Value Ref Range   Sodium 135 135 - 145 mmol/L   Potassium 3.6 3.5 - 5.1 mmol/L   Chloride 105 101 - 111 mmol/L   CO2 21 (L) 22 - 32 mmol/L   Glucose, Bld 100 (H) 65 - 99 mg/dL   BUN 8 6 - 20 mg/dL   Creatinine, Ser 7.820.59 0.44 - 1.00 mg/dL   Calcium 9.1 8.9 - 95.610.3 mg/dL   Total Protein 7.0 6.5 - 8.1 g/dL   Albumin 4.3 3.5 - 5.0 g/dL   AST 22 15 - 41 U/L   ALT 28 14 - 54 U/L   Alkaline Phosphatase 44 38 - 126 U/L   Total Bilirubin 1.0 0.3 - 1.2 mg/dL   GFR calc non Af Amer >60 >60 mL/min   GFR calc Af Amer >60 >60 mL/min   Anion gap 9 5 - 15  hCG, quantitative, pregnancy     Status: Abnormal   Collection Time: 10/18/16  4:10 PM  Result Value Ref Range    hCG, Beta Chain, Quant, S 2,780 (H) <5 mIU/mL  Lipase, blood     Status: None   Collection Time: 10/18/16  4:10 PM  Result Value Ref Range   Lipase 18 11 - 51 U/L    MDM UPT positive IV fluids & phenergan 25 mg IV CBC, CMP, lipase Pt afebrile --- labs & exam do not indicate emergent process. As patient has not been NPO for 8+ hrs, RUQ ultrasound will not be accurate. Will have pt return in the morning for ultrasounds & make her NPO after midnight. As pt is not having lower abdominal pain or vaginal bleeding, will have OB ultrasound scheduled at same time.  Discussed with Dr. Macon LargeAnyanwu who is agreeable to plan of outpatient ultrasounds as patient is not acutely ill at this time  Assessment and Plan  A: 1. Nausea and vomiting during pregnancy prior to [redacted] weeks gestation   2. Abdominal pain, right upper quadrant    P: Discharge home Rx phenergan Take diclegis as prescribed NPO after midnight U/s appointment scheduled for tomorrow morning at 815 am Discussed reasons to go to ED or return to MAU including fever, uncontrolled vomiting, and/or worsening abdominal pain  Judeth Hornrin Shandrell Boda 10/18/2016, 3:54 PM

## 2016-10-18 NOTE — MAU Note (Signed)
Pt stated she was sent from her office for a gallbladder U/S. [facility] but they refused to do it since she was pregnant. Stated her office sent a referral for the u/s to [facility]. No order in the computer at this time. Pt is having pain in her URQ.

## 2016-10-19 ENCOUNTER — Telehealth: Payer: Self-pay | Admitting: Student

## 2016-10-19 ENCOUNTER — Ambulatory Visit (HOSPITAL_COMMUNITY)
Admission: RE | Admit: 2016-10-19 | Discharge: 2016-10-19 | Disposition: A | Discharge status: Home / Self Care | Payer: BLUE CROSS/BLUE SHIELD | Source: Ambulatory Visit | Attending: Student | Admitting: Student

## 2016-10-19 DIAGNOSIS — Z331 Pregnant state, incidental: Secondary | ICD-10-CM | POA: Diagnosis not present

## 2016-10-19 DIAGNOSIS — O219 Vomiting of pregnancy, unspecified: Secondary | ICD-10-CM | POA: Diagnosis not present

## 2016-10-19 DIAGNOSIS — R109 Unspecified abdominal pain: Secondary | ICD-10-CM | POA: Insufficient documentation

## 2016-10-19 DIAGNOSIS — O26891 Other specified pregnancy related conditions, first trimester: Secondary | ICD-10-CM | POA: Diagnosis not present

## 2016-10-19 DIAGNOSIS — R1011 Right upper quadrant pain: Secondary | ICD-10-CM | POA: Diagnosis not present

## 2016-10-19 NOTE — Telephone Encounter (Signed)
Verified identity by name & DOB.  Discussed results of ultrasounds.  Patient needs to call Dr. Kateri PlummerMorrow (her PCP) regarding RUQ ultrasound & management.  Patient should call Dr. Dion BodyVarnado to establish prenatal care & set up f/u ultrasound as needed.  If patient develops lower abdominal pain and/or vaginal bleeding, should come directly to MAU for evaluation.  Patient verbalized understanding & had no further questions.

## 2016-10-22 DIAGNOSIS — Z331 Pregnant state, incidental: Secondary | ICD-10-CM | POA: Diagnosis not present

## 2016-10-22 DIAGNOSIS — E282 Polycystic ovarian syndrome: Secondary | ICD-10-CM | POA: Diagnosis not present

## 2016-10-22 DIAGNOSIS — N939 Abnormal uterine and vaginal bleeding, unspecified: Secondary | ICD-10-CM | POA: Diagnosis not present

## 2016-11-07 DIAGNOSIS — Z3481 Encounter for supervision of other normal pregnancy, first trimester: Secondary | ICD-10-CM | POA: Diagnosis not present

## 2016-11-07 DIAGNOSIS — Z3480 Encounter for supervision of other normal pregnancy, unspecified trimester: Secondary | ICD-10-CM | POA: Diagnosis not present

## 2016-11-07 DIAGNOSIS — Z3A08 8 weeks gestation of pregnancy: Secondary | ICD-10-CM | POA: Diagnosis not present

## 2016-11-07 DIAGNOSIS — O26841 Uterine size-date discrepancy, first trimester: Secondary | ICD-10-CM | POA: Diagnosis not present

## 2016-11-07 LAB — OB RESULTS CONSOLE GC/CHLAMYDIA
Chlamydia: NEGATIVE
Gonorrhea: NEGATIVE

## 2016-11-07 LAB — OB RESULTS CONSOLE RPR: RPR: NONREACTIVE

## 2016-11-07 LAB — OB RESULTS CONSOLE ABO/RH: RH Type: POSITIVE

## 2016-11-07 LAB — OB RESULTS CONSOLE HEPATITIS B SURFACE ANTIGEN: HEP B S AG: NEGATIVE

## 2016-11-07 LAB — OB RESULTS CONSOLE RUBELLA ANTIBODY, IGM: RUBELLA: IMMUNE

## 2016-11-07 LAB — OB RESULTS CONSOLE ANTIBODY SCREEN: ANTIBODY SCREEN: NEGATIVE

## 2016-11-07 LAB — OB RESULTS CONSOLE HIV ANTIBODY (ROUTINE TESTING): HIV: NONREACTIVE

## 2016-11-19 DIAGNOSIS — Z3481 Encounter for supervision of other normal pregnancy, first trimester: Secondary | ICD-10-CM | POA: Diagnosis not present

## 2016-11-21 DIAGNOSIS — R160 Hepatomegaly, not elsewhere classified: Secondary | ICD-10-CM | POA: Diagnosis not present

## 2016-11-21 DIAGNOSIS — R1011 Right upper quadrant pain: Secondary | ICD-10-CM | POA: Diagnosis not present

## 2016-12-04 DIAGNOSIS — Z3481 Encounter for supervision of other normal pregnancy, first trimester: Secondary | ICD-10-CM | POA: Diagnosis not present

## 2017-01-18 ENCOUNTER — Other Ambulatory Visit: Payer: Self-pay | Admitting: Obstetrics and Gynecology

## 2017-01-18 ENCOUNTER — Ambulatory Visit (HOSPITAL_COMMUNITY)
Admission: RE | Admit: 2017-01-18 | Discharge: 2017-01-18 | Disposition: A | Discharge status: Home / Self Care | Payer: BLUE CROSS/BLUE SHIELD | Source: Ambulatory Visit | Attending: Obstetrics and Gynecology | Admitting: Obstetrics and Gynecology

## 2017-01-18 DIAGNOSIS — R102 Pelvic and perineal pain: Secondary | ICD-10-CM | POA: Diagnosis not present

## 2017-01-18 DIAGNOSIS — Z3A18 18 weeks gestation of pregnancy: Secondary | ICD-10-CM

## 2017-01-18 DIAGNOSIS — O99212 Obesity complicating pregnancy, second trimester: Secondary | ICD-10-CM

## 2017-01-18 DIAGNOSIS — O26893 Other specified pregnancy related conditions, third trimester: Secondary | ICD-10-CM

## 2017-01-18 DIAGNOSIS — R1031 Right lower quadrant pain: Secondary | ICD-10-CM

## 2017-01-18 DIAGNOSIS — Z3689 Encounter for other specified antenatal screening: Secondary | ICD-10-CM

## 2017-01-18 DIAGNOSIS — O09292 Supervision of pregnancy with other poor reproductive or obstetric history, second trimester: Secondary | ICD-10-CM | POA: Insufficient documentation

## 2017-01-30 ENCOUNTER — Inpatient Hospital Stay (HOSPITAL_COMMUNITY)
Admission: AD | Admit: 2017-01-30 | Discharge: 2017-01-30 | Disposition: A | Discharge status: Home / Self Care | Payer: BLUE CROSS/BLUE SHIELD | Source: Ambulatory Visit | Attending: Obstetrics and Gynecology | Admitting: Obstetrics and Gynecology

## 2017-01-30 ENCOUNTER — Encounter (HOSPITAL_COMMUNITY): Payer: Self-pay

## 2017-01-30 DIAGNOSIS — O210 Mild hyperemesis gravidarum: Secondary | ICD-10-CM | POA: Insufficient documentation

## 2017-01-30 DIAGNOSIS — O219 Vomiting of pregnancy, unspecified: Secondary | ICD-10-CM | POA: Diagnosis not present

## 2017-01-30 DIAGNOSIS — Z3A00 Weeks of gestation of pregnancy not specified: Secondary | ICD-10-CM | POA: Diagnosis not present

## 2017-01-30 DIAGNOSIS — R1110 Vomiting, unspecified: Secondary | ICD-10-CM

## 2017-01-30 LAB — URINALYSIS, ROUTINE W REFLEX MICROSCOPIC
BILIRUBIN URINE: NEGATIVE
Glucose, UA: NEGATIVE mg/dL
Hgb urine dipstick: NEGATIVE
KETONES UR: NEGATIVE mg/dL
LEUKOCYTES UA: NEGATIVE
NITRITE: NEGATIVE
PROTEIN: NEGATIVE mg/dL
Specific Gravity, Urine: 1.014 (ref 1.005–1.030)
pH: 6 (ref 5.0–8.0)

## 2017-01-30 LAB — COMPREHENSIVE METABOLIC PANEL
ALBUMIN: 3.8 g/dL (ref 3.5–5.0)
ALT: 14 U/L (ref 14–54)
ANION GAP: 8 (ref 5–15)
AST: 15 U/L (ref 15–41)
Alkaline Phosphatase: 39 U/L (ref 38–126)
BUN: 5 mg/dL — AB (ref 6–20)
CHLORIDE: 108 mmol/L (ref 101–111)
CO2: 20 mmol/L — ABNORMAL LOW (ref 22–32)
Calcium: 8.8 mg/dL — ABNORMAL LOW (ref 8.9–10.3)
Creatinine, Ser: 0.53 mg/dL (ref 0.44–1.00)
GFR calc Af Amer: >60 mL/min (ref >60)
GFR calc non Af Amer: >60 mL/min (ref >60)
GLUCOSE: 84 mg/dL (ref 65–99)
POTASSIUM: 3.7 mmol/L (ref 3.5–5.1)
Sodium: 136 mmol/L (ref 135–145)
Total Bilirubin: 0.7 mg/dL (ref 0.3–1.2)
Total Protein: 6.7 g/dL (ref 6.5–8.1)

## 2017-01-30 MED ORDER — PROMETHAZINE HCL 12.5 MG PO TABS: ORAL_TABLET | ORAL | 2 refills | Status: DC

## 2017-01-30 MED ORDER — PROMETHAZINE HCL 25 MG/ML IJ SOLN
12.5000 mg | Freq: Four times a day (QID) | INTRAMUSCULAR | Status: DC | PRN
  Administered 2017-01-30: 12.5 mg via INTRAVENOUS
  Filled 2017-01-30: qty 1

## 2017-01-30 MED ORDER — PROMETHAZINE HCL 25 MG PO TABS: 12.5000 mg | ORAL_TABLET | Freq: Four times a day (QID) | ORAL | Status: DC | PRN

## 2017-01-30 MED ORDER — PROMETHAZINE HCL 25 MG RE SUPP: 12.5000 mg | Freq: Four times a day (QID) | RECTAL | Status: DC | PRN

## 2017-01-30 MED ORDER — LACTATED RINGERS IV BOLUS (SEPSIS)
1000.0000 mL | Freq: Once | INTRAVENOUS | Status: AC
  Administered 2017-01-30: 1000 mL via INTRAVENOUS

## 2017-01-30 NOTE — Progress Notes (Addendum)
G2P1 @ [redacted] wksga. Presents to triage for N/V. Last time vomited was today at 0930. denies LOF or bleeding. Doppler 147  1334: IV bolus completed. Pt states feeling much better.   1335: Provider notified. Report given on current status. Will discharge pt home. Aware of labs.

## 2017-01-30 NOTE — MAU Note (Signed)
Sent by OB office N/V x2 days Emesis episodes in last 24 hours =4 Has tried zofran and diclegis

## 2017-01-30 NOTE — Discharge Instructions (Signed)

## 2017-02-19 DIAGNOSIS — O99212 Obesity complicating pregnancy, second trimester: Secondary | ICD-10-CM | POA: Diagnosis not present

## 2017-02-28 DIAGNOSIS — J3089 Other allergic rhinitis: Secondary | ICD-10-CM | POA: Diagnosis not present

## 2017-02-28 DIAGNOSIS — R21 Rash and other nonspecific skin eruption: Secondary | ICD-10-CM | POA: Diagnosis not present

## 2017-03-19 DIAGNOSIS — Z3482 Encounter for supervision of other normal pregnancy, second trimester: Secondary | ICD-10-CM | POA: Diagnosis not present

## 2017-03-19 DIAGNOSIS — O99212 Obesity complicating pregnancy, second trimester: Secondary | ICD-10-CM | POA: Diagnosis not present

## 2017-04-02 DIAGNOSIS — Z23 Encounter for immunization: Secondary | ICD-10-CM | POA: Diagnosis not present

## 2017-04-16 DIAGNOSIS — N761 Subacute and chronic vaginitis: Secondary | ICD-10-CM | POA: Diagnosis not present

## 2017-04-16 DIAGNOSIS — Z23 Encounter for immunization: Secondary | ICD-10-CM | POA: Diagnosis not present

## 2017-05-01 DIAGNOSIS — O26899 Other specified pregnancy related conditions, unspecified trimester: Secondary | ICD-10-CM | POA: Diagnosis not present

## 2017-05-01 DIAGNOSIS — O479 False labor, unspecified: Secondary | ICD-10-CM | POA: Diagnosis not present

## 2017-05-01 DIAGNOSIS — B373 Candidiasis of vulva and vagina: Secondary | ICD-10-CM | POA: Diagnosis not present

## 2017-05-06 DIAGNOSIS — R2689 Other abnormalities of gait and mobility: Secondary | ICD-10-CM | POA: Diagnosis not present

## 2017-05-06 DIAGNOSIS — M25552 Pain in left hip: Secondary | ICD-10-CM | POA: Diagnosis not present

## 2017-05-06 DIAGNOSIS — M545 Low back pain: Secondary | ICD-10-CM | POA: Diagnosis not present

## 2017-05-06 DIAGNOSIS — M25551 Pain in right hip: Secondary | ICD-10-CM | POA: Diagnosis not present

## 2017-05-14 DIAGNOSIS — M25551 Pain in right hip: Secondary | ICD-10-CM | POA: Diagnosis not present

## 2017-05-14 DIAGNOSIS — R2689 Other abnormalities of gait and mobility: Secondary | ICD-10-CM | POA: Diagnosis not present

## 2017-05-14 DIAGNOSIS — M545 Low back pain: Secondary | ICD-10-CM | POA: Diagnosis not present

## 2017-05-14 DIAGNOSIS — M25552 Pain in left hip: Secondary | ICD-10-CM | POA: Diagnosis not present

## 2017-05-15 ENCOUNTER — Encounter (HOSPITAL_COMMUNITY): Payer: Self-pay

## 2017-05-15 ENCOUNTER — Inpatient Hospital Stay (HOSPITAL_COMMUNITY)
Admission: AD | Admit: 2017-05-15 | Discharge: 2017-05-15 | Disposition: A | Discharge status: Home / Self Care | Payer: BLUE CROSS/BLUE SHIELD | Source: Ambulatory Visit | Attending: Obstetrics and Gynecology | Admitting: Obstetrics and Gynecology

## 2017-05-15 DIAGNOSIS — Z3A35 35 weeks gestation of pregnancy: Secondary | ICD-10-CM | POA: Diagnosis not present

## 2017-05-15 DIAGNOSIS — R110 Nausea: Secondary | ICD-10-CM | POA: Insufficient documentation

## 2017-05-15 DIAGNOSIS — Z833 Family history of diabetes mellitus: Secondary | ICD-10-CM | POA: Insufficient documentation

## 2017-05-15 DIAGNOSIS — R55 Syncope and collapse: Secondary | ICD-10-CM | POA: Diagnosis not present

## 2017-05-15 DIAGNOSIS — Z888 Allergy status to other drugs, medicaments and biological substances status: Secondary | ICD-10-CM | POA: Diagnosis not present

## 2017-05-15 DIAGNOSIS — Z885 Allergy status to narcotic agent status: Secondary | ICD-10-CM | POA: Diagnosis not present

## 2017-05-15 DIAGNOSIS — Z3689 Encounter for other specified antenatal screening: Secondary | ICD-10-CM

## 2017-05-15 DIAGNOSIS — R9431 Abnormal electrocardiogram [ECG] [EKG]: Secondary | ICD-10-CM | POA: Insufficient documentation

## 2017-05-15 DIAGNOSIS — Z809 Family history of malignant neoplasm, unspecified: Secondary | ICD-10-CM | POA: Diagnosis not present

## 2017-05-15 DIAGNOSIS — Z91048 Other nonmedicinal substance allergy status: Secondary | ICD-10-CM | POA: Insufficient documentation

## 2017-05-15 DIAGNOSIS — O26893 Other specified pregnancy related conditions, third trimester: Secondary | ICD-10-CM | POA: Diagnosis not present

## 2017-05-15 DIAGNOSIS — R42 Dizziness and giddiness: Secondary | ICD-10-CM | POA: Diagnosis not present

## 2017-05-15 DIAGNOSIS — O9989 Other specified diseases and conditions complicating pregnancy, childbirth and the puerperium: Secondary | ICD-10-CM | POA: Diagnosis not present

## 2017-05-15 DIAGNOSIS — Z8249 Family history of ischemic heart disease and other diseases of the circulatory system: Secondary | ICD-10-CM | POA: Insufficient documentation

## 2017-05-15 DIAGNOSIS — Z820 Family history of epilepsy and other diseases of the nervous system: Secondary | ICD-10-CM | POA: Insufficient documentation

## 2017-05-15 DIAGNOSIS — Z9889 Other specified postprocedural states: Secondary | ICD-10-CM | POA: Insufficient documentation

## 2017-05-15 DIAGNOSIS — Z87891 Personal history of nicotine dependence: Secondary | ICD-10-CM | POA: Insufficient documentation

## 2017-05-15 LAB — URINALYSIS, ROUTINE W REFLEX MICROSCOPIC
BILIRUBIN URINE: NEGATIVE
Glucose, UA: NEGATIVE mg/dL
HGB URINE DIPSTICK: NEGATIVE
Ketones, ur: NEGATIVE mg/dL
NITRITE: NEGATIVE
PH: 7 (ref 5.0–8.0)
Protein, ur: NEGATIVE mg/dL
SPECIFIC GRAVITY, URINE: 1.003 — AB (ref 1.005–1.030)

## 2017-05-15 LAB — COMPREHENSIVE METABOLIC PANEL
ALK PHOS: 72 U/L (ref 38–126)
ALT: 10 U/L — AB (ref 14–54)
AST: 13 U/L — AB (ref 15–41)
Albumin: 2.9 g/dL — ABNORMAL LOW (ref 3.5–5.0)
Anion gap: 7 (ref 5–15)
BILIRUBIN TOTAL: 0.3 mg/dL (ref 0.3–1.2)
BUN: 5 mg/dL — AB (ref 6–20)
CALCIUM: 8.8 mg/dL — AB (ref 8.9–10.3)
CO2: 22 mmol/L (ref 22–32)
CREATININE: 0.47 mg/dL (ref 0.44–1.00)
Chloride: 108 mmol/L (ref 101–111)
Glucose, Bld: 106 mg/dL — ABNORMAL HIGH (ref 65–99)
Potassium: 3.8 mmol/L (ref 3.5–5.1)
Sodium: 137 mmol/L (ref 135–145)
Total Protein: 6.4 g/dL — ABNORMAL LOW (ref 6.5–8.1)

## 2017-05-15 LAB — CBC
HEMATOCRIT: 32.8 % — AB (ref 36.0–46.0)
HEMOGLOBIN: 11.1 g/dL — AB (ref 12.0–15.0)
MCH: 28.2 pg (ref 26.0–34.0)
MCHC: 33.8 g/dL (ref 30.0–36.0)
MCV: 83.2 fL (ref 78.0–100.0)
Platelets: 201 10*3/uL (ref 150–400)
RBC: 3.94 MIL/uL (ref 3.87–5.11)
RDW: 13.5 % (ref 11.5–15.5)
WBC: 12.1 10*3/uL — AB (ref 4.0–10.5)

## 2017-05-15 LAB — PROTEIN / CREATININE RATIO, URINE: CREATININE, URINE: 29 mg/dL

## 2017-05-15 NOTE — MAU Note (Signed)
Urine in lab 

## 2017-05-15 NOTE — Discharge Instructions (Signed)
Near-Syncope °Near-syncope is when you suddenly get weak or dizzy, or you feel like you might pass out (faint). During an episode of near-syncope, you may: °· Feel dizzy or light-headed. °· Feel sick to your stomach (nauseous). °· See all white or all black. °· Have cold, clammy skin. ° °If you passed out, get help right away.Call your local emergency services (911 in the [place] Do not drive yourself to the hospital. °Follow these instructions at home: °Pay attention to any changes in your symptoms. Take these actions to help with your condition: °· Have someone stay with you until you feel stable. °· Do not drive, use machinery, or play sports until your doctor says it is okay. °· Keep all follow-up visits as told by your doctor. This is important. °· If you start to feel like you might pass out, lie down right away and raise (elevate) your feet above the level of your heart. Breathe deeply and steadily. Wait until all of the symptoms are gone. °· Drink enough fluid to keep your pee (urine) clear or pale yellow. °· If you are taking blood pressure or heart medicine, get up slowly and spend many minutes getting ready to sit and then stand. This can help with dizziness. °· Take over-the-counter and prescription medicines only as told by your doctor. ° °Get help right away if: °· You have a very bad headache. °· You have unusual pain in your chest, tummy, or back. °· You are bleeding from your mouth or rectum. °· You have black or tarry poop (stool). °· You have a very fast or uneven heartbeat (palpitations). °· You pass out one time or more than once. °· You have jerky movements that you cannot control (seizure). °· You are confused. °· You have trouble walking. °· You are very weak. °· You have vision problems. °These symptoms may be an emergency. Do not wait to see if the symptoms will go away. Get medical help right away. Call your local emergency services (911 in the [place] Do not drive yourself to the  hospital. °This information is not intended to replace advice given to you by your health care provider. Make sure you discuss any questions you have with your health care provider. °Document Released: 01/09/2008 Document Revised: 12/29/2015 Document Reviewed: 04/06/2015 °Elsevier Interactive Patient Education © 2017 [employer] ° °

## 2017-05-15 NOTE — MAU Note (Signed)
Pt presents with c/o dizziness that began 10:30 this morning.    Pt reports she currently has H/A, reports took Tylenol earlier, no relief noted.  States she was light headed and felt like she was going to pass out.

## 2017-05-15 NOTE — MAU Provider Note (Signed)
History     CSN: [id number]  Arrival date and time: 05/15/17 1224   First Provider Initiated Contact with Patient 05/15/17 1259      Chief Complaint  Patient presents with  . Dizziness   G2P1001 .0 weeks here with episode of dizziness, lightheadedness, and nausea. She first felt sx around 1030 and has felt "off" since. No syncope. Associated sx are frontal HA for which she took Tylenol but has not helped. Denies visual disturbances and epigastric pain. Reports eating a Reese cup and juice at breakfast time and had 2 bottles of water since. Wasn't hungry at lunch, just picked at food. Denies recent illness or fever. Denies VB, LOF, and ctx. Reports good FM.    OB History    Gravida Para Term Preterm AB Living   0 0 1   SAB TAB Ectopic Multiple Live Births   0 0 0 0 1      Past Medical History:  Diagnosis Date  . Gallbladder attack     Past Surgical History:  Procedure Laterality Date  . TONSILLECTOMY      Family History  Problem Relation Age of Onset  . Diabetes Maternal Grandmother   . Cancer Maternal Grandmother   . Hyperlipidemia Maternal Grandmother   . Hyperlipidemia Paternal Grandmother   . Diabetes Paternal Grandmother   . Parkinson's disease Paternal Grandmother   . Diabetes Paternal Grandfather   . Hyperlipidemia Paternal Grandfather   . Hypertension Mother   . Depression Mother   . Parkinson's disease Mother   . Diabetes Father   . Hypertension Father     Social History  Substance Use Topics  . Smoking status: Former Smoker    Types: Cigarettes  . Smokeless tobacco: Never Used  . Alcohol use 1.8 oz/week    3 Cans of beer per week     Comment: not while preg    Allergies:  Allergies  Allergen Reactions  . Erythromycin Itching  . Oxycontin [Oxycodone Hcl] Itching  . Vicodin [Hydrocodone-Acetaminophen] Itching  . Mold Extract [Trichophyton] Rash    No prescriptions prior to admission.    Review of Systems  Constitutional:  Negative for fever.  Respiratory: Negative for shortness of breath.   Cardiovascular: Negative for chest pain.  Gastrointestinal: Negative for abdominal pain.  Genitourinary: Negative for vaginal bleeding and vaginal discharge.  Neurological: Positive for dizziness, light-headedness and headaches. Negative for syncope.   Physical Exam   Blood pressure 121/67, pulse 97, temperature 98.1 F (36.7 C), temperature source Oral, resp. rate 18, last menstrual period 09/12/2016, SpO2 98 %, unknown if currently breastfeeding.  Physical Exam  Constitutional: She is oriented to person, place, and time. She appears well-developed and well-nourished. No distress.  HENT:  Head: Normocephalic and atraumatic.  Neck: Normal range of motion.  Cardiovascular: Regular rhythm and normal heart sounds.   tachy  Respiratory: Effort normal and breath sounds normal. No respiratory distress. She has no wheezes. She has no rales.  GI: Soft. She exhibits no distension. There is no tenderness.  gravid  Musculoskeletal: Normal range of motion. She exhibits no edema.  Neurological: She is alert and oriented to person, place, and time.  Skin: Skin is warm and dry.  Psychiatric: She has a normal mood and affect.  EFM: 150 bpm, mod variability, +accels, no decels Toco: none  Results for orders placed or performed during the hospital encounter of 05/15/17 (from the past 24 hour(s))  Protein / creatinine ratio, urine  Status: None   Collection Time: 05/15/17 12:26 PM  Result Value Ref Range   Creatinine, Urine 29.00 mg/dL   Total Protein, Urine <6 mg/dL   Protein Creatinine Ratio        0.00 - 0.15 mg/mg[Cre]  Urinalysis, Routine w reflex microscopic     Status: Abnormal   Collection Time: 05/15/17 12:52 PM  Result Value Ref Range   Color, Urine YELLOW YELLOW   APPearance HAZY (A) CLEAR   Specific Gravity, Urine 1.003 (L) 1.005 - 1.030   pH 7.0 5.0 - 8.0   Glucose, UA NEGATIVE NEGATIVE mg/dL   Hgb urine  dipstick NEGATIVE NEGATIVE   Bilirubin Urine NEGATIVE NEGATIVE   Ketones, ur NEGATIVE NEGATIVE mg/dL   Protein, ur NEGATIVE NEGATIVE mg/dL   Nitrite NEGATIVE NEGATIVE   Leukocytes, UA LARGE (A) NEGATIVE   RBC / HPF 0-5 0 - 5 RBC/hpf   WBC, UA 6-30 0 - 5 WBC/hpf   Bacteria, UA MANY (A) NONE SEEN   Squamous Epithelial / LPF 6-30 (A) NONE SEEN   Budding Yeast PRESENT   CBC     Status: Abnormal   Collection Time: 05/15/17  1:16 PM  Result Value Ref Range   WBC 12.1 (H) 4.0 - 10.5 K/uL   RBC 3.94 3.87 - 5.11 MIL/uL   Hemoglobin 11.1 (L) 12.0 - 15.0 g/dL   HCT 16.1 (L) 09.6 - 04.5 %   MCV 83.2 78.0 - 100.0 fL   MCH 28.2 26.0 - 34.0 pg   MCHC 33.8 30.0 - 36.0 g/dL   RDW 40.9 81.1 - 91.4 %   Platelets 201 150 - 400 K/uL  Comprehensive metabolic panel     Status: Abnormal   Collection Time: 05/15/17  1:16 PM  Result Value Ref Range   Sodium 137 135 - 145 mmol/L   Potassium 3.8 3.5 - 5.1 mmol/L   Chloride 108 101 - 111 mmol/L   CO2 22 22 - 32 mmol/L   Glucose, Bld 106 (H) 65 - 99 mg/dL   BUN 5 (L) 6 - 20 mg/dL   Creatinine, Ser 7.82 0.44 - 1.00 mg/dL   Calcium 8.8 (L) 8.9 - 10.3 mg/dL   Total Protein 6.4 (L) 6.5 - 8.1 g/dL   Albumin 2.9 (L) 3.5 - 5.0 g/dL   AST 13 (L) 15 - 41 U/L   ALT 10 (L) 14 - 54 U/L   Alkaline Phosphatase 72 38 - 126 U/L   Total Bilirubin 0.3 0.3 - 1.2 mg/dL   GFR calc non Af Amer >60 >60 mL/min   GFR calc Af Amer >60 >60 mL/min   Anion gap 7 5 - 15   MAU Course  Procedures  MDM EKG and labs ordered. EKG: normal sinus tach, read by Dr. Doroteo Glassman. Labs reviewed and nml. Pt feeling better now then upon arrival. Sx likely caused from high glucose intake this am causing flucuating blood glucose. Presentation, clinical findings, and plan discussed with Dr. Dion Body. Stable for discharge home.  Assessment and Plan   1. [redacted] weeks gestation of pregnancy   2. NST (non-stress test) reactive   3. Near syncope    Discharge home Follow up in OB office next week as  scheduled Hydrate Rest  Allergies as of 05/15/2017      Reactions   Erythromycin Itching   Oxycontin [oxycodone Hcl] Itching   Vicodin [hydrocodone-acetaminophen] Itching   Mold Extract [trichophyton] Rash      Medication List    TAKE these medications  acetaminophen 325 MG tablet Commonly known as:  TYLENOL Take 650 mg by mouth every 6 (six) hours as needed for headache.   calcium carbonate 500 MG chewable tablet Commonly known as:  TUMS - dosed in mg elemental calcium Chew 2-4 tablets by mouth daily as needed for indigestion or heartburn.   diphenhydrAMINE 25 mg capsule Commonly known as:  BENADRYL Take 25 mg by mouth at bedtime as needed for allergies or sleep.   famotidine 20 MG tablet Commonly known as:  PEPCID Take 20 mg by mouth at bedtime.   ondansetron 4 MG disintegrating tablet Commonly known as:  ZOFRAN-ODT Take 4 mg by mouth every 8 (eight) hours as needed for nausea or vomiting.   PRENATAL GUMMIES/DHA & FA PO Take 2 tablets by mouth daily.   promethazine 12.5 MG tablet Commonly known as:  PHENERGAN Take 1-2 tablets every 6 hours prn nausea and/or vomiting.      'Donette Larry, CNM 05/15/2017, 3:57 PM

## 2017-05-21 DIAGNOSIS — R000 Tachycardia, unspecified: Secondary | ICD-10-CM | POA: Diagnosis not present

## 2017-05-28 DIAGNOSIS — Z3483 Encounter for supervision of other normal pregnancy, third trimester: Secondary | ICD-10-CM | POA: Diagnosis not present

## 2017-05-30 ENCOUNTER — Inpatient Hospital Stay (HOSPITAL_COMMUNITY)
Admission: AD | Admit: 2017-05-30 | Discharge: 2017-05-30 | Disposition: A | Discharge status: Home / Self Care | Payer: BLUE CROSS/BLUE SHIELD | Source: Ambulatory Visit | Attending: Obstetrics and Gynecology | Admitting: Obstetrics and Gynecology

## 2017-05-30 ENCOUNTER — Encounter (HOSPITAL_COMMUNITY): Payer: Self-pay | Admitting: *Deleted

## 2017-05-30 DIAGNOSIS — O163 Unspecified maternal hypertension, third trimester: Secondary | ICD-10-CM | POA: Insufficient documentation

## 2017-05-30 DIAGNOSIS — O26893 Other specified pregnancy related conditions, third trimester: Secondary | ICD-10-CM | POA: Insufficient documentation

## 2017-05-30 DIAGNOSIS — N898 Other specified noninflammatory disorders of vagina: Secondary | ICD-10-CM | POA: Diagnosis not present

## 2017-05-30 DIAGNOSIS — Z3A37 37 weeks gestation of pregnancy: Secondary | ICD-10-CM | POA: Diagnosis not present

## 2017-05-30 DIAGNOSIS — Z3689 Encounter for other specified antenatal screening: Secondary | ICD-10-CM

## 2017-05-30 LAB — URINALYSIS, ROUTINE W REFLEX MICROSCOPIC
Bilirubin Urine: NEGATIVE
Glucose, UA: NEGATIVE mg/dL
Hgb urine dipstick: NEGATIVE
Ketones, ur: NEGATIVE mg/dL
Nitrite: NEGATIVE
PH: 7 (ref 5.0–8.0)
Protein, ur: NEGATIVE mg/dL
SPECIFIC GRAVITY, URINE: 1.003 — AB (ref 1.005–1.030)

## 2017-05-30 LAB — CBC
HEMATOCRIT: 33.9 % — AB (ref 36.0–46.0)
HEMOGLOBIN: 11.2 g/dL — AB (ref 12.0–15.0)
MCH: 27.6 pg (ref 26.0–34.0)
MCHC: 33 g/dL (ref 30.0–36.0)
MCV: 83.5 fL (ref 78.0–100.0)
Platelets: 245 10*3/uL (ref 150–400)
RBC: 4.06 MIL/uL (ref 3.87–5.11)
RDW: 13.5 % (ref 11.5–15.5)
WBC: 11.9 10*3/uL — AB (ref 4.0–10.5)

## 2017-05-30 LAB — COMPREHENSIVE METABOLIC PANEL
ALBUMIN: 3.1 g/dL — AB (ref 3.5–5.0)
ALT: 14 U/L (ref 14–54)
AST: 15 U/L (ref 15–41)
Alkaline Phosphatase: 89 U/L (ref 38–126)
Anion gap: 8 (ref 5–15)
BILIRUBIN TOTAL: 0.3 mg/dL (ref 0.3–1.2)
BUN: 5 mg/dL — AB (ref 6–20)
CO2: 23 mmol/L (ref 22–32)
CREATININE: 0.51 mg/dL (ref 0.44–1.00)
Calcium: 8.7 mg/dL — ABNORMAL LOW (ref 8.9–10.3)
Chloride: 105 mmol/L (ref 101–111)
GFR calc Af Amer: >60 mL/min (ref >60)
GFR calc non Af Amer: >60 mL/min (ref >60)
GLUCOSE: 97 mg/dL (ref 65–99)
POTASSIUM: 3.5 mmol/L (ref 3.5–5.1)
Sodium: 136 mmol/L (ref 135–145)
TOTAL PROTEIN: 6.6 g/dL (ref 6.5–8.1)

## 2017-05-30 LAB — PROTEIN / CREATININE RATIO, URINE: Creatinine, Urine: 33 mg/dL

## 2017-05-30 LAB — OB RESULTS CONSOLE GBS: GBS: NEGATIVE

## 2017-05-30 LAB — AMNISURE RUPTURE OF MEMBRANE (ROM) NOT AT ARMC: Amnisure ROM: NEGATIVE

## 2017-05-30 LAB — POCT FERN TEST: POCT FERN TEST: NEGATIVE

## 2017-05-30 NOTE — Discharge Instructions (Signed)
Braxton Hicks Contractions °Contractions of the uterus can occur throughout pregnancy, but they are not always a sign that you are in labor. You may have practice contractions called Braxton Hicks contractions. These false labor contractions are sometimes confused with true labor. °What are Braxton Hicks contractions? °Braxton Hicks contractions are tightening movements that occur in the muscles of the uterus before labor. Unlike true labor contractions, these contractions do not result in opening (dilation) and thinning of the cervix. Toward the end of pregnancy (32-34 weeks), Braxton Hicks contractions can happen more often and may become stronger. These contractions are sometimes difficult to tell apart from true labor because they can be very uncomfortable. You should not feel embarrassed if you go to the hospital with false labor. °Sometimes, the only way to tell if you are in true labor is for your health care provider to look for changes in the cervix. The health care provider will do a physical exam and may monitor your contractions. If you are not in true labor, the exam should show that your cervix is not dilating and your water has not broken. °If there are no prenatal problems or other health problems associated with your pregnancy, it is completely safe for you to be sent home with false labor. You may continue to have Braxton Hicks contractions until you go into true labor. °How can I tell the difference between true labor and false labor? °· Differences °? False labor °? Contractions last 30-70 seconds.: Contractions are usually shorter and not as strong as true labor contractions. °? Contractions become very regular.: Contractions are usually irregular. °? Discomfort is usually felt in the top of the uterus, and it spreads to the lower abdomen and low back.: Contractions are often felt in the front of the lower abdomen and in the groin. °? Contractions do not go away with walking.: Contractions may  go away when you walk around or change positions while lying down. °? Contractions usually become more intense and increase in frequency.: Contractions get weaker and are shorter-lasting as time goes on. °? The cervix dilates and gets thinner.: The cervix usually does not dilate or become thin. °Follow these instructions at home: °· Take over-the-counter and prescription medicines only as told by your health care provider. °· Keep up with your usual exercises and follow other instructions from your health care provider. °· Eat and drink lightly if you think you are going into labor. °· If Braxton Hicks contractions are making you uncomfortable: °? Change your position from lying down or resting to walking, or change from walking to resting. °? Sit and rest in a tub of warm water. °? Drink enough fluid to keep your urine clear or pale yellow. Dehydration may cause these contractions. °? Do slow and deep breathing several times an hour. °· Keep all follow-up prenatal visits as told by your health care provider. This is important. °Contact a health care provider if: °· You have a fever. °· You have continuous pain in your abdomen. °Get help right away if: °· Your contractions become stronger, more regular, and closer together. °· You have fluid leaking or gushing from your vagina. °· You pass blood-tinged mucus (bloody show). °· You have bleeding from your vagina. °· You have low back pain that you never had before. °· You feel your baby’s head pushing down and causing pelvic pressure. °· Your baby is not moving inside you as much as it used to. °Summary °· Contractions that occur before labor are   called Braxton Hicks contractions, false labor, or practice contractions. °· Braxton Hicks contractions are usually shorter, weaker, farther apart, and less regular than true labor contractions. True labor contractions usually become progressively stronger and regular and they become more frequent. °· Manage discomfort from  Braxton Hicks contractions by changing position, resting in a warm bath, drinking plenty of water, or practicing deep breathing. °This information is not intended to replace advice given to you by your health care provider. Make sure you discuss any questions you have with your health care provider. °Document Released: 07/23/2005 Document Revised: 06/11/2016 Document Reviewed: 06/11/2016 °Elsevier Interactive Patient Education © 2017 [employer] °Hypertension During Pregnancy °Hypertension is also called high blood pressure. High blood pressure means that the force of your blood moving in your body is too strong. When you are pregnant, this condition should be watched carefully. It can cause problems for you and your baby. °Follow these instructions at home: °Eating and drinking °· Drink enough fluid to keep your pee (urine) clear or pale yellow. °· Eat healthy foods that are low in salt (sodium). °? Do not add salt to your food. °? Check labels on foods and drinks to see much salt is in them. Look on the label where you see "Sodium." °Lifestyle °· Do not use any products that contain nicotine or tobacco, such as cigarettes and e-cigarettes. If you need help quitting, ask your doctor. °· Do not use alcohol. °· Avoid caffeine. °· Avoid stress. Rest and get plenty of sleep. °General instructions °· Take over-the-counter and prescription medicines only as told by your doctor. °· While lying down, lie on your left side. This keeps pressure off your baby. °· While sitting or lying down, raise (elevate) your feet. Try putting some pillows under your lower legs. °· Exercise regularly. Ask your doctor what kinds of exercise are best for you. °· Keep all prenatal and follow-up visits as told by your doctor. This is important. °Contact a doctor if: °· You have symptoms that your doctor told you to watch for, such as: °? Fever. °? Throwing up (vomiting). °? Headache. °Get help right away if: °· You have very bad pain in your  belly (abdomen). °· You are throwing up, and this does not get better with treatment. °· You suddenly get swelling in your hands, ankles, or face. °· You gain 4 lb (1.8 kg) or more in 1 week. °· You get bleeding from your vagina. °· You have blood in your pee. °· You do not feel your baby moving as much as normal. °· You have a change in vision. °· You have muscle twitching or sudden tightening (spasms). °· You have trouble breathing. °· Your lips or fingernails turn blue. °This information is not intended to replace advice given to you by your health care provider. Make sure you discuss any questions you have with your health care provider. °Document Released: 08/25/2010 Document Revised: 04/03/2016 Document Reviewed: 04/03/2016 °Elsevier Interactive Patient Education © 2017 [employer] ° °

## 2017-05-30 NOTE — Progress Notes (Signed)
Dr. Dion BodyVarnado called to MAU for an update on Ms.Weidner. Provider made aware of pending amniosure and BP's

## 2017-05-30 NOTE — MAU Note (Signed)
Pt C/O uc's all day, thinks her water broke around 1830, large gush of clear fluid.  Denies bleeding, some decreased FM.

## 2017-05-30 NOTE — MAU Provider Note (Signed)
Patient Cheryl [name] is a 26 y.o. G2P1001 At [redacted]w[redacted]d weeks here with complaints of vaginal discharge. She denies decreased fetal movements or bleeding.   History    Patient states that at about 6:30 this evening she was vomiting. After vomiting she felt a pop and felt water dripping down her leg. She describes it as similar to when her water broke with her first son. It was clear/white and had no odor. She says it was not urine and it was not normal discharge.  CSN: [id number]  Arrival date and time: 05/30/17 1833  Chief Complaint  Patient presents with  . Labor Eval   Vaginal Discharge  The patient's primary symptoms include vaginal discharge. Associated symptoms include abdominal pain (ctx). Pertinent negatives include no headaches. The vaginal discharge was clear and watery. There has been no bleeding. She has not been passing clots. She has not been passing tissue. Nothing aggravates the symptoms. She has tried nothing for the symptoms.    OB History    Gravida Para Term Preterm AB Living   2 1 1  0 0 1   SAB TAB Ectopic Multiple Live Births   0 0 0 0 1      Past Medical History:  Diagnosis Date  . Gallbladder attack     Past Surgical History:  Procedure Laterality Date  . TONSILLECTOMY      Family History  Problem Relation Age of Onset  . Diabetes Maternal Grandmother   . Cancer Maternal Grandmother   . Hyperlipidemia Maternal Grandmother   . Hyperlipidemia Paternal Grandmother   . Diabetes Paternal Grandmother   . Parkinson's disease Paternal Grandmother   . Diabetes Paternal Grandfather   . Hyperlipidemia Paternal Grandfather   . Hypertension Mother   . Depression Mother   . Parkinson's disease Mother   . Diabetes Father   . Hypertension Father     Social History  Substance Use Topics  . Smoking status: Former Smoker    Types: Cigarettes  . Smokeless tobacco: Never Used  . Alcohol use 1.8 oz/week    3 Cans of beer per week     Comment: not while  preg    Allergies:  Allergies  Allergen Reactions  . Erythromycin Itching  . Oxycontin [Oxycodone Hcl] Itching  . Vicodin [Hydrocodone-Acetaminophen] Itching  . Mold Extract [Trichophyton] Rash    Prescriptions Prior to Admission  Medication Sig Dispense Refill Last Dose  . acetaminophen (TYLENOL) 325 MG tablet Take 650 mg by mouth every 6 (six) hours as needed for headache.    05/29/2017 at Unknown time  . calcium carbonate (TUMS - DOSED IN MG ELEMENTAL CALCIUM) 500 MG chewable tablet Chew 2-4 tablets by mouth daily as needed for indigestion or heartburn.    05/29/2017 at Unknown time  . diphenhydrAMINE (BENADRYL) 25 mg capsule Take 25 mg by mouth at bedtime as needed for allergies or sleep.    05/29/2017 at Unknown time  . ondansetron (ZOFRAN-ODT) 4 MG disintegrating tablet Take 4 mg by mouth every 8 (eight) hours as needed for nausea or vomiting.   05/30/2017 at Unknown time  . Prenatal MV-Min-FA-Omega-3 (PRENATAL GUMMIES/DHA & FA PO) Take 2 tablets by mouth daily.   05/29/2017 at Unknown time  . promethazine (PHENERGAN) 12.5 MG tablet Take 1-2 tablets every 6 hours prn nausea and/or vomiting. 30 tablet 2 Past Week at Unknown time  . famotidine (PEPCID) 20 MG tablet Take 20 mg by mouth at bedtime.   More than a month at Unknown  time    Review of Systems  HENT: Negative.   Eyes: Negative for visual disturbance.  Respiratory: Negative.   Gastrointestinal: Positive for abdominal pain (ctx).  Genitourinary: Positive for vaginal discharge.  Musculoskeletal: Negative.   Neurological: Negative.  Negative for headaches.  Psychiatric/Behavioral: Negative.    Physical Exam   Blood pressure 131/78, pulse (!) 104, temperature (!) 97.5 F (36.4 C), temperature source Oral, resp. rate 20, last menstrual period 09/12/2016, unknown if currently breastfeeding.  Patient Vitals for the past 24 hrs:  BP Temp Temp src Pulse Resp  05/30/17 2204 123/84 - - 90 18  05/30/17 2111 109/77 - - 92 -   05/30/17 2031 112/75 - - (!) 106 -  05/30/17 2016 128/71 - - (!) 104 -  05/30/17 2001 131/78 - - (!) 104 -  05/30/17 1950 (!) 149/72 - - (!) 109 -  05/30/17 1929 (!) 142/75 - - (!) 109 -  05/30/17 1843 140/89 (!) 97.5 F (36.4 C) Oral (!) 117 20    Physical Exam  Constitutional: She is oriented to person, place, and time. She appears well-developed and well-nourished.  HENT:  Head: Normocephalic.  Neck: Normal range of motion.  Respiratory: Effort normal.  GI: Soft.  Genitourinary:  Genitourinary Comments: NEFG; negative pooling. No lesions on vaginal walls, cervix is pink, Fingertip dilation.   Musculoskeletal: Normal range of motion.  Neurological: She is alert and oriented to person, place, and time.  Skin: Skin is warm and dry.  Psychiatric: She has a normal mood and affect.   Results for orders placed or performed during the hospital encounter of 05/30/17 (from the past 24 hour(s))  Urinalysis, Routine w reflex microscopic     Status: Abnormal   Collection Time: 05/30/17  7:02 PM  Result Value Ref Range   Color, Urine STRAW (A) YELLOW   APPearance HAZY (A) CLEAR   Specific Gravity, Urine 1.003 (L) 1.005 - 1.030   pH 7.0 5.0 - 8.0   Glucose, UA NEGATIVE NEGATIVE mg/dL   Hgb urine dipstick NEGATIVE NEGATIVE   Bilirubin Urine NEGATIVE NEGATIVE   Ketones, ur NEGATIVE NEGATIVE mg/dL   Protein, ur NEGATIVE NEGATIVE mg/dL   Nitrite NEGATIVE NEGATIVE   Leukocytes, UA TRACE (A) NEGATIVE   RBC / HPF 0-5 0 - 5 RBC/hpf   WBC, UA 0-5 0 - 5 WBC/hpf   Bacteria, UA RARE (A) NONE SEEN   Squamous Epithelial / LPF 0-5 (A) NONE SEEN   Mucus PRESENT   Protein / creatinine ratio, urine     Status: None   Collection Time: 05/30/17  7:03 PM  Result Value Ref Range   Creatinine, Urine 33.00 mg/dL   Total Protein, Urine <6 mg/dL   Protein Creatinine Ratio        0.00 - 0.15 mg/mg[Cre]  Amnisure rupture of membrane (rom)not at [facility]     Status: None   Collection Time: 05/30/17  7:43 PM   Result Value Ref Range   Amnisure ROM NEGATIVE   Fern Test     Status: Normal   Collection Time: 05/30/17  7:45 PM  Result Value Ref Range   POCT Fern Test Negative = intact amniotic membranes   CBC     Status: Abnormal   Collection Time: 05/30/17  9:10 PM  Result Value Ref Range   WBC 11.9 (H) 4.0 - 10.5 K/uL   RBC 4.06 3.87 - 5.11 MIL/uL   Hemoglobin 11.2 (L) 12.0 - 15.0 g/dL   HCT 11.9 (L) 14.7 -  46.0 %   MCV 83.5 78.0 - 100.0 fL   MCH 27.6 26.0 - 34.0 pg   MCHC 33.0 30.0 - 36.0 g/dL   RDW 82.913.5 56.211.5 - 13.015.5 %   Platelets 245 150 - 400 K/uL  Comprehensive metabolic panel     Status: Abnormal   Collection Time: 05/30/17  9:10 PM  Result Value Ref Range   Sodium 136 135 - 145 mmol/L   Potassium 3.5 3.5 - 5.1 mmol/L   Chloride 105 101 - 111 mmol/L   CO2 23 22 - 32 mmol/L   Glucose, Bld 97 65 - 99 mg/dL   BUN 5 (L) 6 - 20 mg/dL   Creatinine, Ser 8.650.51 0.44 - 1.00 mg/dL   Calcium 8.7 (L) 8.9 - 10.3 mg/dL   Total Protein 6.6 6.5 - 8.1 g/dL   Albumin 3.1 (L) 3.5 - 5.0 g/dL   AST 15 15 - 41 U/L   ALT 14 14 - 54 U/L   Alkaline Phosphatase 89 38 - 126 U/L   Total Bilirubin 0.3 0.3 - 1.2 mg/dL   GFR calc non Af Amer >60 >60 mL/min   GFR calc Af Amer >60 >60 mL/min   Anion gap 8 5 - 15    MAU Course  Procedures  MDM -NST: 140 bpm, pres acel, neg decel, mod variability, no decels.  -3 elevated blood pressures; will draw CBC and CMP. -PCR is undectable -Amnisure and sterile fern negative Patient has an appointment on Tuesday, Oct 30 with Dr. Dion BodyVarnado.   Patient care handed over to [facility]anie Letasha Kershaw; CNM.  Luna KitchensKathryn Kooistra CNM 05/30/17 8:09 pm  Pre-e labs pending> no evidence of SROM, labor, or pre-e. Presentation, clinical findings, and plan discussed with Dr. Dion BodyVarnado. Stable for discharge home.  Assessment and Plan   1. [redacted] weeks gestation of pregnancy   2. NST (non-stress test) reactive   3. Hypertension during pregnancy in third trimester, unspecified hypertension in  pregnancy type   4. Vaginal discharge during pregnancy in third trimester    Discharge home Follow up in OB office next week as scheduled Pre-e precautions Labor precautions  Allergies as of 05/30/2017      Reactions   Erythromycin Itching   Oxycontin [oxycodone Hcl] Itching   Vicodin [hydrocodone-acetaminophen] Itching   Mold Extract [trichophyton] Rash      Medication List    TAKE these medications   acetaminophen 325 MG tablet Commonly known as:  TYLENOL Take 650 mg by mouth every 6 (six) hours as needed for headache.   calcium carbonate 500 MG chewable tablet Commonly known as:  TUMS - dosed in mg elemental calcium Chew 2-4 tablets by mouth daily as needed for indigestion or heartburn.   diphenhydrAMINE 25 mg capsule Commonly known as:  BENADRYL Take 25 mg by mouth at bedtime as needed for allergies or sleep.   famotidine 20 MG tablet Commonly known as:  PEPCID Take 20 mg by mouth at bedtime.   ondansetron 4 MG disintegrating tablet Commonly known as:  ZOFRAN-ODT Take 4 mg by mouth every 8 (eight) hours as needed for nausea or vomiting.   PRENATAL GUMMIES/DHA & FA PO Take 2 tablets by mouth daily.   promethazine 12.5 MG tablet Commonly known as:  PHENERGAN Take 1-2 tablets every 6 hours prn nausea and/or vomiting.      Cheryl [name], Cheryl [name], CNM  05/30/2017 11:14 PM

## 2017-05-30 NOTE — MAU Note (Signed)
Pt reports leaking of fluid since 1830. Pt states that she heard a pop and had a large gush after vomiting. Pt reports that when she stands up more fluid leaks out. + FM. GBS Done on Tuesday per pt. Denies complications with this pregnancy

## 2017-06-04 DIAGNOSIS — O2613 Low weight gain in pregnancy, third trimester: Secondary | ICD-10-CM | POA: Diagnosis not present

## 2017-06-10 ENCOUNTER — Encounter (HOSPITAL_COMMUNITY): Payer: Self-pay | Admitting: *Deleted

## 2017-06-10 ENCOUNTER — Telehealth (HOSPITAL_COMMUNITY): Payer: Self-pay | Admitting: *Deleted

## 2017-06-10 NOTE — Telephone Encounter (Signed)
Preadmission screen  

## 2017-06-11 DIAGNOSIS — L299 Pruritus, unspecified: Secondary | ICD-10-CM | POA: Diagnosis not present

## 2017-06-11 DIAGNOSIS — O288 Other abnormal findings on antenatal screening of mother: Secondary | ICD-10-CM | POA: Diagnosis not present

## 2017-06-11 DIAGNOSIS — O26613 Liver and biliary tract disorders in pregnancy, third trimester: Secondary | ICD-10-CM | POA: Diagnosis not present

## 2017-06-12 ENCOUNTER — Telehealth (HOSPITAL_COMMUNITY): Payer: Self-pay | Admitting: *Deleted

## 2017-06-12 ENCOUNTER — Encounter (HOSPITAL_COMMUNITY): Payer: Self-pay | Admitting: *Deleted

## 2017-06-12 NOTE — Telephone Encounter (Signed)
Preadmission screen  

## 2017-06-13 ENCOUNTER — Inpatient Hospital Stay (HOSPITAL_COMMUNITY): Payer: BLUE CROSS/BLUE SHIELD | Admitting: Anesthesiology

## 2017-06-13 ENCOUNTER — Encounter (HOSPITAL_COMMUNITY): Payer: Self-pay

## 2017-06-13 ENCOUNTER — Encounter (HOSPITAL_COMMUNITY): Payer: Self-pay | Admitting: Anesthesiology

## 2017-06-13 ENCOUNTER — Inpatient Hospital Stay (HOSPITAL_COMMUNITY)
Admission: AD | Admit: 2017-06-13 | Discharge: 2017-06-15 | DRG: 805 | Disposition: A | Discharge status: Home / Self Care | Payer: BLUE CROSS/BLUE SHIELD | Source: Ambulatory Visit | Attending: Obstetrics and Gynecology | Admitting: Obstetrics and Gynecology

## 2017-06-13 DIAGNOSIS — O2662 Liver and biliary tract disorders in childbirth: Secondary | ICD-10-CM | POA: Diagnosis not present

## 2017-06-13 DIAGNOSIS — O99214 Obesity complicating childbirth: Secondary | ICD-10-CM | POA: Diagnosis not present

## 2017-06-13 DIAGNOSIS — O99344 Other mental disorders complicating childbirth: Secondary | ICD-10-CM | POA: Diagnosis present

## 2017-06-13 DIAGNOSIS — Z87891 Personal history of nicotine dependence: Secondary | ICD-10-CM

## 2017-06-13 DIAGNOSIS — F410 Panic disorder [episodic paroxysmal anxiety] without agoraphobia: Secondary | ICD-10-CM | POA: Diagnosis present

## 2017-06-13 DIAGNOSIS — Z3A39 39 weeks gestation of pregnancy: Secondary | ICD-10-CM

## 2017-06-13 DIAGNOSIS — O480 Post-term pregnancy: Secondary | ICD-10-CM | POA: Diagnosis not present

## 2017-06-13 DIAGNOSIS — Z3689 Encounter for other specified antenatal screening: Secondary | ICD-10-CM

## 2017-06-13 DIAGNOSIS — K831 Obstruction of bile duct: Secondary | ICD-10-CM | POA: Diagnosis not present

## 2017-06-13 DIAGNOSIS — O368130 Decreased fetal movements, third trimester, not applicable or unspecified: Principal | ICD-10-CM | POA: Diagnosis present

## 2017-06-13 LAB — CBC
HEMATOCRIT: 34.9 % — AB (ref 36.0–46.0)
HEMOGLOBIN: 11.5 g/dL — AB (ref 12.0–15.0)
MCH: 26.8 pg (ref 26.0–34.0)
MCHC: 33 g/dL (ref 30.0–36.0)
MCV: 81.4 fL (ref 78.0–100.0)
Platelets: 225 10*3/uL (ref 150–400)
RBC: 4.29 MIL/uL (ref 3.87–5.11)
RDW: 13.7 % (ref 11.5–15.5)
WBC: 10.3 10*3/uL (ref 4.0–10.5)

## 2017-06-13 LAB — TYPE AND SCREEN
ABO/RH(D): A POS
Antibody Screen: NEGATIVE

## 2017-06-13 MED ORDER — OXYTOCIN 40 UNITS IN LACTATED RINGERS INFUSION - SIMPLE MED
1.0000 m[IU]/min | INTRAVENOUS | Status: DC
  Administered 2017-06-13: 2 m[IU]/min via INTRAVENOUS
  Filled 2017-06-13: qty 1000

## 2017-06-13 MED ORDER — PHENYLEPHRINE 40 MCG/ML (10ML) SYRINGE FOR IV PUSH (FOR BLOOD PRESSURE SUPPORT)
80.0000 ug | PREFILLED_SYRINGE | INTRAVENOUS | Status: DC | PRN
Start: 2017-06-13 — End: 2017-06-15
  Filled 2017-06-13 (×2): qty 10
  Filled 2017-06-13: qty 5

## 2017-06-13 MED ORDER — DIPHENHYDRAMINE HCL 50 MG/ML IJ SOLN: 12.5000 mg | INTRAMUSCULAR | Status: DC | PRN

## 2017-06-13 MED ORDER — OXYTOCIN 40 UNITS IN LACTATED RINGERS INFUSION - SIMPLE MED: 2.5000 [IU]/h | INTRAVENOUS | Status: DC

## 2017-06-13 MED ORDER — PHENYLEPHRINE 40 MCG/ML (10ML) SYRINGE FOR IV PUSH (FOR BLOOD PRESSURE SUPPORT)
80.0000 ug | PREFILLED_SYRINGE | INTRAVENOUS | Status: DC | PRN
Start: 2017-06-13 — End: 2017-06-15
  Filled 2017-06-13: qty 5

## 2017-06-13 MED ORDER — LIDOCAINE HCL (PF) 1 % IJ SOLN
INTRAMUSCULAR | Status: DC | PRN
  Administered 2017-06-13: 13 mL via EPIDURAL

## 2017-06-13 MED ORDER — LIDOCAINE-EPINEPHRINE (PF) 2 %-1:200000 IJ SOLN
INTRAMUSCULAR | Status: DC | PRN
  Administered 2017-06-13: 6 mL via EPIDURAL

## 2017-06-13 MED ORDER — LACTATED RINGERS IV SOLN: 500.0000 mL | INTRAVENOUS | Status: DC | PRN

## 2017-06-13 MED ORDER — FENTANYL 2.5 MCG/ML BUPIVACAINE 1/10 % EPIDURAL INFUSION (WH - ANES)
14.0000 mL/h | INTRAMUSCULAR | Status: DC | PRN
  Administered 2017-06-13: 14 mL/h via EPIDURAL
  Filled 2017-06-13 (×2): qty 100

## 2017-06-13 MED ORDER — LIDOCAINE HCL (PF) 1 % IJ SOLN
30.0000 mL | INTRAMUSCULAR | Status: DC | PRN
  Filled 2017-06-13: qty 30

## 2017-06-13 MED ORDER — LACTATED RINGERS IV SOLN
INTRAVENOUS | Status: DC
  Administered 2017-06-13 (×2): via INTRAVENOUS

## 2017-06-13 MED ORDER — LACTATED RINGERS IV SOLN
500.0000 mL | Freq: Once | INTRAVENOUS | Status: AC
  Administered 2017-06-13: 500 mL via INTRAVENOUS

## 2017-06-13 MED ORDER — LORAZEPAM 2 MG/ML IJ SOLN
1.0000 mg | Freq: Three times a day (TID) | INTRAMUSCULAR | Status: DC | PRN
  Administered 2017-06-13: 1 mg via INTRAVENOUS
  Filled 2017-06-13 (×2): qty 0.5

## 2017-06-13 MED ORDER — ZOLPIDEM TARTRATE 5 MG PO TABS: 5.0000 mg | ORAL_TABLET | Freq: Every evening | ORAL | Status: DC | PRN

## 2017-06-13 MED ORDER — EPHEDRINE 5 MG/ML INJ
10.0000 mg | INTRAVENOUS | Status: DC | PRN
  Filled 2017-06-13: qty 2

## 2017-06-13 MED ORDER — FENTANYL CITRATE (PF) 100 MCG/2ML IJ SOLN
INTRAMUSCULAR | Status: AC
  Filled 2017-06-13: qty 2

## 2017-06-13 MED ORDER — TERBUTALINE SULFATE 1 MG/ML IJ SOLN
0.2500 mg | Freq: Once | INTRAMUSCULAR | Status: DC | PRN
  Filled 2017-06-13: qty 1

## 2017-06-13 MED ORDER — MISOPROSTOL 25 MCG QUARTER TABLET
25.0000 ug | ORAL_TABLET | ORAL | Status: DC | PRN
  Administered 2017-06-13: 25 ug via VAGINAL
  Filled 2017-06-13 (×2): qty 1

## 2017-06-13 MED ORDER — FENTANYL CITRATE (PF) 100 MCG/2ML IJ SOLN
INTRAMUSCULAR | Status: DC | PRN
  Administered 2017-06-13: 100 ug via EPIDURAL

## 2017-06-13 MED ORDER — ONDANSETRON HCL 4 MG/2ML IJ SOLN: 4.0000 mg | Freq: Four times a day (QID) | INTRAMUSCULAR | Status: DC | PRN

## 2017-06-13 MED ORDER — OXYTOCIN BOLUS FROM INFUSION
500.0000 mL | Freq: Once | INTRAVENOUS | Status: AC
Start: 2017-06-13 — End: 2017-06-13
  Administered 2017-06-13: 500 mL via INTRAVENOUS

## 2017-06-13 MED ORDER — SOD CITRATE-CITRIC ACID 500-334 MG/5ML PO SOLN: 30.0000 mL | ORAL | Status: DC | PRN

## 2017-06-13 NOTE — Anesthesia Preprocedure Evaluation (Signed)
Anesthesia Evaluation  Patient identified by MRN, date of birth, ID band Patient awake    Reviewed: Allergy & Precautions, H&P , NPO status , Patient's Chart, lab work & pertinent test results  Airway Mallampati: III  TM Distance: >3 FB Neck ROM: full    Dental no notable dental hx.    Pulmonary former smoker,    Pulmonary exam normal breath sounds clear to auscultation       Cardiovascular negative cardio ROS Normal cardiovascular exam Rhythm:Regular Rate:Normal     Neuro/Psych negative neurological ROS  negative psych ROS   GI/Hepatic negative GI ROS, Neg liver ROS,   Endo/Other  Morbid obesity  Renal/GU negative Renal ROS     Musculoskeletal   Abdominal Normal abdominal exam  (+)   Peds  Hematology negative hematology ROS (+)   Anesthesia Other Findings   Reproductive/Obstetrics (+) Pregnancy                             Anesthesia Physical  Anesthesia Plan  ASA: III  Anesthesia Plan: Epidural   Post-op Pain Management:    Induction:   PONV Risk Score and Plan:   Airway Management Planned:   Additional Equipment:   Intra-op Plan:   Post-operative Plan:   Informed Consent: I have reviewed the patients History and Physical, chart, labs and discussed the procedure including the risks, benefits and alternatives for the proposed anesthesia with the patient or authorized representative who has indicated his/her understanding and acceptance.     Plan Discussed with:   Anesthesia Plan Comments:         Anesthesia Quick Evaluation

## 2017-06-13 NOTE — Progress Notes (Signed)
S: Pt feeling more comfortable O Fht's- 145-150 with minimal variaiblity and early decels with each contraction     Uc's- 2 A: Active labor P Recheck cervix prn or in 1 hour     Anticipate vaginal delivery Illene BolusLori Clemmons CNM

## 2017-06-13 NOTE — MAU Provider Note (Signed)
History     CSN: [id number]  Arrival date and time: 06/13/17 [id number]   First Provider Initiated Contact with Patient 06/13/17 1016      Chief Complaint  Patient presents with  . Decreased Fetal Movement   G2P1001 @39 .1 wks here with decreased FM this am. She reports only feeling 5 movements in 2 hrs. Reports baby is always more active than this in the am. Denies VB, LOF, and regular ctx. Her pregnancy has been complicated by Cholestasis.    Addendum:  Pt was ruled out for cholestasis.  Pt had a Nonreactive NST 2 days ago.  BPP was 8/10.  Pt reports increased fetal movement now in birthing suites. Normal prenatal care with Dr. Dion BodyVarnado.   OB History    Gravida Para Term Preterm AB Living   2 1 1  0 0 1   SAB TAB Ectopic Multiple Live Births   0 0 0 0 1      Past Medical History:  Diagnosis Date  . Gallbladder attack   . Liver cyst   . Medical history non-contributory     Past Surgical History:  Procedure Laterality Date  . TONSILLECTOMY      Family History  Problem Relation Age of Onset  . Diabetes Maternal Grandmother   . Cancer Maternal Grandmother   . Hyperlipidemia Maternal Grandmother   . Hyperlipidemia Paternal Grandmother   . Diabetes Paternal Grandmother   . Parkinson's disease Paternal Grandmother   . Diabetes Paternal Grandfather   . Hyperlipidemia Paternal Grandfather   . Hypertension Mother   . Depression Mother   . Parkinson's disease Mother   . Thyroid disease Mother   . Diabetes Father   . Hypertension Father   . Birth defects Sister        heart defect brought on by labor    Social History   Tobacco Use  . Smoking status: Former Smoker    Types: Cigarettes  . Smokeless tobacco: Never Used  Substance Use Topics  . Alcohol use: Yes    Alcohol/week: 1.8 oz    Types: 3 Cans of beer per week    Comment: not while preg  . Drug use: No    Allergies:  Allergies  Allergen Reactions  . Erythromycin Itching  . Oxycontin [Oxycodone Hcl]  Itching  . Vicodin [Hydrocodone-Acetaminophen] Itching  . Mold Extract [Trichophyton] Rash    Medications Prior to Admission  Medication Sig Dispense Refill Last Dose  . acetaminophen (TYLENOL) 325 MG tablet Take 650 mg by mouth every 6 (six) hours as needed for headache.    06/12/2017 at Unknown time  . calcium carbonate (TUMS - DOSED IN MG ELEMENTAL CALCIUM) 500 MG chewable tablet Chew 2-4 tablets by mouth daily as needed for indigestion or heartburn.    06/12/2017 at Unknown time  . diphenhydrAMINE (BENADRYL) 25 mg capsule Take 25 mg by mouth at bedtime as needed for allergies or sleep.    06/12/2017 at Unknown time  . hydrOXYzine (ATARAX/VISTARIL) 25 MG tablet Take 25 mg daily as needed by mouth for anxiety.   06/12/2017 at Unknown time  . ondansetron (ZOFRAN-ODT) 4 MG disintegrating tablet Take 4 mg by mouth every 8 (eight) hours as needed for nausea or vomiting.   06/12/2017 at Unknown time  . pantoprazole (PROTONIX) 40 MG tablet Take 40 mg daily by mouth.   06/12/2017 at Unknown time  . Prenatal MV-Min-FA-Omega-3 (PRENATAL GUMMIES/DHA & FA PO) Take 2 tablets by mouth daily.   06/12/2017 at Unknown time  .  famotidine (PEPCID) 20 MG tablet Take 20 mg by mouth at bedtime.   More than a month at Unknown time  . promethazine (PHENERGAN) 12.5 MG tablet Take 1-2 tablets every 6 hours prn nausea and/or vomiting. 30 tablet 2 prn    Review of Systems  Constitutional: Negative.   Gastrointestinal: Negative for abdominal pain.  Genitourinary: Negative for vaginal bleeding.   Physical Exam   Blood pressure 128/84, pulse (!) 119, temperature 97.9 F (36.6 C), resp. rate 18, height 5\' 4"  (1.626 m), weight 247 lb (112 kg), last menstrual period 09/12/2016, SpO2 100 %, unknown if currently breastfeeding.  Physical Exam  Constitutional: She is oriented to person, place, and time. She appears well-developed and well-nourished. No distress.  HENT:  Head: Normocephalic and atraumatic.  Neck: Normal range  of motion.  Respiratory: Effort normal. No respiratory distress.  Genitourinary:  Genitourinary Comments: Pelvic exam deferred by Dr. Dion BodyVarnado.  Musculoskeletal: Normal range of motion.  Neurological: She is alert and oriented to person, place, and time.  Skin: Skin is warm and dry.  Psychiatric: She has a normal mood and affect.  EFM: 145 bpm, mod variability, + accels, ? variable decels Toco: none  No results found for this or any previous visit (from the past 24 hour(s)).  MAU Course  Procedures  MDM NST reactive. Discussed presentation and findings with Dr. Dion BodyVarnado, will plan for admit and IOL.   Assessment and Plan  39 weeks Reactive NST Decreased fetal movement Admit to BS Management per Dr. Jillene BucksVarnado  Melanie Bhambri, CNM 06/13/2017, 10:28 AM   Addendum:  Pt seen and examined.  Additional history as above. Reassuring fetal tracing.  2-3 cm by RN, long, thick.  Cytotec placed. PIT form done separately. Plan: Pitocin 4 hours after Cytotec placed. Epidural per pt request.

## 2017-06-13 NOTE — H&P (Addendum)
CSN: [id number]  Arrival date and time: 06/13/17 [id number]   First Provider Initiated Contact with Patient 06/13/17 1016         Chief Complaint  Patient presents with  . Decreased Fetal Movement   G2P1001 @39 .1 wks here with decreased FM this am. She reports only feeling 5 movements in 2 hrs. Reports baby is always more active than this in the am. Denies VB, LOF, and regular ctx. Her pregnancy has been complicated by Cholestasis.    Addendum:  Pt was ruled out for cholestasis.  Pt had a Nonreactive NST 2 days ago.  BPP was 8/10.  Pt reports increased fetal movement now in birthing suites. Normal prenatal care with Dr. Dion BodyVarnado.  Hyperemesis resolved.  Weight loss with poor maternal weight gain.  Fetal growth has been normal by ultrasound.  GBS negative.           OB History    Gravida Para Term Preterm AB Living   2 1 1  0 0 1   SAB TAB Ectopic Multiple Live Births   0 0 0 0 1          Past Medical History:  Diagnosis Date  . Gallbladder attack   . Liver cyst   . Medical history non-contributory          Past Surgical History:  Procedure Laterality Date  . TONSILLECTOMY           Family History  Problem Relation Age of Onset  . Diabetes Maternal Grandmother   . Cancer Maternal Grandmother   . Hyperlipidemia Maternal Grandmother   . Hyperlipidemia Paternal Grandmother   . Diabetes Paternal Grandmother   . Parkinson's disease Paternal Grandmother   . Diabetes Paternal Grandfather   . Hyperlipidemia Paternal Grandfather   . Hypertension Mother   . Depression Mother   . Parkinson's disease Mother   . Thyroid disease Mother   . Diabetes Father   . Hypertension Father   . Birth defects Sister        heart defect brought on by labor    Social History        Tobacco Use  . Smoking status: Former Smoker    Types: Cigarettes  . Smokeless tobacco: Never Used  Substance Use Topics  . Alcohol use: Yes    Alcohol/week:  1.8 oz    Types: 3 Cans of beer per week    Comment: not while preg  . Drug use: No    Allergies:      Allergies  Allergen Reactions  . Erythromycin Itching  . Oxycontin [Oxycodone Hcl] Itching  . Vicodin [Hydrocodone-Acetaminophen] Itching  . Mold Extract [Trichophyton] Rash           Medications Prior to Admission  Medication Sig Dispense Refill Last Dose  . acetaminophen (TYLENOL) 325 MG tablet Take 650 mg by mouth every 6 (six) hours as needed for headache.    06/12/2017 at Unknown time  . calcium carbonate (TUMS - DOSED IN MG ELEMENTAL CALCIUM) 500 MG chewable tablet Chew 2-4 tablets by mouth daily as needed for indigestion or heartburn.    06/12/2017 at Unknown time  . diphenhydrAMINE (BENADRYL) 25 mg capsule Take 25 mg by mouth at bedtime as needed for allergies or sleep.    06/12/2017 at Unknown time  . hydrOXYzine (ATARAX/VISTARIL) 25 MG tablet Take 25 mg daily as needed by mouth for anxiety.   06/12/2017 at Unknown time  . ondansetron (ZOFRAN-ODT) 4 MG disintegrating tablet Take 4  mg by mouth every 8 (eight) hours as needed for nausea or vomiting.   06/12/2017 at Unknown time  . pantoprazole (PROTONIX) 40 MG tablet Take 40 mg daily by mouth.   06/12/2017 at Unknown time  . Prenatal MV-Min-FA-Omega-3 (PRENATAL GUMMIES/DHA & FA PO) Take 2 tablets by mouth daily.   06/12/2017 at Unknown time  . famotidine (PEPCID) 20 MG tablet Take 20 mg by mouth at bedtime.   More than a month at Unknown time  . promethazine (PHENERGAN) 12.5 MG tablet Take 1-2 tablets every 6 hours prn nausea and/or vomiting. 30 tablet 2 prn    Review of Systems  Constitutional: Negative.   Gastrointestinal: Negative for abdominal pain.  Genitourinary: Negative for vaginal bleeding.   Physical Exam   Blood pressure 128/84, pulse (!) 119, temperature 97.9 F (36.6 C), resp. rate 18, height 5\' 4"  (1.626 m), weight 247 lb (112 kg), last menstrual period 09/12/2016, SpO2 100 %, unknown  if currently breastfeeding.  Physical Exam  Constitutional: She is oriented to person, place, and time. She appears well-developed and well-nourished. No distress.  HENT:  Head: Normocephalic and atraumatic.  Neck: Normal range of motion.  Respiratory: Effort normal. No respiratory distress.  Genitourinary:  Genitourinary Comments: Pelvic exam deferred by Dr. Dion BodyVarnado.  Musculoskeletal: Normal range of motion.  Neurological: She is alert and oriented to person, place, and time.  Skin: Skin is warm and dry.  Psychiatric: She has a normal mood and affect.  EFM: 145 bpm, mod variability, + accels, ? variable decels Toco: none  LabResultsLast24Hours  No results found for this or any previous visit (from the past 24 hour(s)).    MAU Course  Procedures  MDM NST reactive. Discussed presentation and findings with Dr. Dion BodyVarnado, will plan for admit and IOL.   Assessment and Plan  39 weeks Reactive NST Decreased fetal movement Admit to BS Management per Dr. Jillene BucksVarnado  Melanie Bhambri, CNM 06/13/2017, 10:28 AM   Addendum:  Pt seen and examined.  Additional history as above. Reassuring fetal tracing.  2-3 cm by RN, long, thick.  Cytotec placed. GBS negative. PIT form done separately. Plan: Pitocin 4 hours after Cytotec placed. Epidural per pt request.

## 2017-06-13 NOTE — MAU Note (Signed)
Pt reports she has nit felt the baby move all morning. Had office visit on Tuesday and did not pass NST but BPP was normal. C/O mild cramping.

## 2017-06-13 NOTE — Progress Notes (Signed)
Ellin GoodieChelsea D Poellnitz is a 26 y.o. G2P1001 at 75107w1d     Subjective: In to assess pt.  Unable to trace contractions per RN. Pt states she is now comfortable s/p epidural.  Pt is very anxious and feels like she is about to have a panic attack.  Feeling a lot of pressure.  Objective: BP 128/71   Pulse 85   Temp 98.7 F (37.1 C)   Resp 18   Ht 5\' 4"  (1.626 m)   Wt 112 kg (247 lb)   LMP 09/12/2016   SpO2 100%   BMI 42.40 kg/m  No intake/output data recorded. No intake/output data recorded.  Gen:  Pt shaking,  Anxious. FHT:  Intermittent tracing but variability is  Good.  FSE placed.  120s baseline. UC:   IUPC placed because contractions were not tracing well.  ?Hyperstimulation with first IUPC.   Second IUPC placed and hyperstimulation confirmed.  SVE:   Dilation: 4 Effacement (%): 70 Station: -2 Exam by:: Khira Cudmore Cervix 5/70/-2  FSE placed without difficulty with pt consent.    Labs: Lab Results  Component Value Date   WBC 10.3 06/13/2017   HGB 11.5 (L) 06/13/2017   HCT 34.9 (L) 06/13/2017   MCV 81.4 06/13/2017   PLT 225 06/13/2017    Assessment / Plan: IUP @ 39 1/7 weeks. Anxiety-  Ativan 1 mg IV now due to onset of panic attack.  Consider Atarax 25-50 mg po going forward prn. Labor: Progressing s/p Cytotec and Pitocing. Pitocin discontinued at 4 mUs due to hyperstimulation Preeclampsia:  Elevated BP prior to epidural.  Monitor closely. Fetal Wellbeing:  Category II Pain Control:  Epidural I/D:  n/a Anticipated MOD:  NSVD   Dr. Sallye OberKulwa and Illene BolusLori Clemmons, CNM coming on.  Pt aware.  Geryl RankinsVARNADO, Roanna Reaves 06/13/2017, 6:27 PM

## 2017-06-13 NOTE — Anesthesia Procedure Notes (Signed)
Epidural Patient location during procedure: OB Start time: 06/13/2017 4:15 PM End time: 06/13/2017 4:28 PM  Staffing Anesthesiologist: Lowella CurbMiller, Chrislyn Seedorf Ray, MD Performed: anesthesiologist   Preanesthetic Checklist Completed: patient identified, site marked, surgical consent, pre-op evaluation, timeout performed, IV checked, risks and benefits discussed and monitors and equipment checked  Epidural Patient position: sitting Prep: ChloraPrep Patient monitoring: heart rate, cardiac monitor, continuous pulse ox and blood pressure Approach: midline Location: L2-L3 Injection technique: LOR saline  Needle:  Needle type: Tuohy  Needle gauge: 17 G Needle length: 9 cm Needle insertion depth: 7 cm Catheter type: closed end flexible Catheter size: 20 Guage Catheter at skin depth: 11 cm Test dose: negative  Assessment Events: blood not aspirated, injection not painful, no injection resistance, negative IV test and no paresthesia  Additional Notes Reason for block:procedure for pain

## 2017-06-13 NOTE — Anesthesia Pain Management Evaluation Note (Signed)
  CRNA Pain Management Visit Note  Patient: Cheryl [name], 26 y.o., female  "Hello I am a member of the anesthesia team [facility]. We have an anesthesia team available at all times to provide care throughout the hospital, including epidural management and anesthesia for C-section. I don't know your plan for the delivery whether it a natural birth, water birth, IV sedation, nitrous supplementation, doula or epidural, but we want to meet your pain goals."   1.Was your pain managed to your expectations on prior hospitalizations?   Yes   2.What is your expectation for pain management during this hospitalization?     Epidural  3.How can we help you reach that goal? epidural  Record the patient's initial score and the patient's pain goal.   Pain: 0  Pain Goal: 4 [facility] wants you to be able to say your pain was always managed very well.  Toshie Demelo 06/13/2017

## 2017-06-13 NOTE — Progress Notes (Signed)
S: Pt very uncomfortable with contractions; feeling pressure; IUPC in place; Pitocin currently off O: BP 126/79   Pulse (!) 112   Temp 98 F (36.7 C) (Oral)   Resp 18   Ht 5\' 4"  (1.626 m)   Wt 247 lb (112 kg)   LMP 09/12/2016   SpO2 100%   BMI 42.40 kg/m  SVE- 8/80/0 st +1 A: Active Labor     G2P1      Postdate Induction P: Emotional Support      Epidural bolus per pt x 3      Will call anesthesia for reinjection if boluses do not work      Anticipate vaginal delivery  The PNC FinancialLori Summer Parthasarathy CNM

## 2017-06-13 NOTE — Anesthesia Pain Management Evaluation Note (Signed)
  CRNA Pain Management Visit Note  Patient: Cheryl GoodieChelsea D Villacres, 26 y.o., female  "Hello I am a member of the anesthesia team [facility]. We have an anesthesia team available at all times to provide care throughout the hospital, including epidural management and anesthesia for C-section. I don't know your plan for the delivery whether it a natural birth, water birth, IV sedation, nitrous supplementation, doula or epidural, but we want to meet your pain goals."   1.Was your pain managed to your expectations on prior hospitalizations?   Yes   2.What is your expectation for pain management during this hospitalization?     Epidural  3.How can we help you reach that goal? Epidural when ready  Record the patient's initial score and the patient's pain goal.   Pain: 0  Pain Goal: 3 [facility] wants you to be able to say your pain was always managed very well.  Edison PaceWILKERSON,Gale Klar 06/13/2017

## 2017-06-14 ENCOUNTER — Encounter (HOSPITAL_COMMUNITY): Payer: Self-pay | Admitting: *Deleted

## 2017-06-14 LAB — CBC
HCT: 28.2 % — ABNORMAL LOW (ref 36.0–46.0)
Hemoglobin: 9.5 g/dL — ABNORMAL LOW (ref 12.0–15.0)
MCH: 27.5 pg (ref 26.0–34.0)
MCHC: 33.7 g/dL (ref 30.0–36.0)
MCV: 81.7 fL (ref 78.0–100.0)
Platelets: 186 10*3/uL (ref 150–400)
RBC: 3.45 MIL/uL — ABNORMAL LOW (ref 3.87–5.11)
RDW: 13.5 % (ref 11.5–15.5)
WBC: 13.6 10*3/uL — ABNORMAL HIGH (ref 4.0–10.5)

## 2017-06-14 LAB — BIRTH TISSUE RECOVERY COLLECTION (PLACENTA DONATION)

## 2017-06-14 LAB — RPR: RPR Ser Ql: NONREACTIVE

## 2017-06-14 MED ORDER — COCONUT OIL OIL: 1.0000 "application " | TOPICAL_OIL | Status: DC | PRN

## 2017-06-14 MED ORDER — WITCH HAZEL-GLYCERIN EX PADS: 1.0000 "application " | MEDICATED_PAD | CUTANEOUS | Status: DC | PRN

## 2017-06-14 MED ORDER — ACETAMINOPHEN 325 MG PO TABS: 650.0000 mg | ORAL_TABLET | ORAL | Status: DC | PRN

## 2017-06-14 MED ORDER — SIMETHICONE 80 MG PO CHEW: 80.0000 mg | CHEWABLE_TABLET | ORAL | Status: DC | PRN

## 2017-06-14 MED ORDER — BENZOCAINE-MENTHOL 20-0.5 % EX AERO: 1.0000 "application " | INHALATION_SPRAY | CUTANEOUS | Status: DC | PRN

## 2017-06-14 MED ORDER — ONDANSETRON HCL 4 MG PO TABS: 4.0000 mg | ORAL_TABLET | ORAL | Status: DC | PRN

## 2017-06-14 MED ORDER — ONDANSETRON HCL 4 MG/2ML IJ SOLN: 4.0000 mg | INTRAMUSCULAR | Status: DC | PRN

## 2017-06-14 MED ORDER — SENNOSIDES-DOCUSATE SODIUM 8.6-50 MG PO TABS: 2.0000 | ORAL_TABLET | ORAL | Status: DC

## 2017-06-14 MED ORDER — IBUPROFEN 600 MG PO TABS
600.0000 mg | ORAL_TABLET | Freq: Four times a day (QID) | ORAL | Status: DC
  Administered 2017-06-14 – 2017-06-15 (×6): 600 mg via ORAL
  Filled 2017-06-14 (×6): qty 1

## 2017-06-14 MED ORDER — DIPHENHYDRAMINE HCL 25 MG PO CAPS: 25.0000 mg | ORAL_CAPSULE | Freq: Four times a day (QID) | ORAL | Status: DC | PRN

## 2017-06-14 MED ORDER — DIBUCAINE 1 % RE OINT: 1.0000 "application " | TOPICAL_OINTMENT | RECTAL | Status: DC | PRN

## 2017-06-14 MED ORDER — PRENATAL MULTIVITAMIN CH
1.0000 | ORAL_TABLET | Freq: Every day | ORAL | Status: DC
  Administered 2017-06-14 – 2017-06-15 (×2): 1 via ORAL
  Filled 2017-06-14 (×2): qty 1

## 2017-06-14 MED ORDER — TETANUS-DIPHTH-ACELL PERTUSSIS 5-2.5-18.5 LF-MCG/0.5 IM SUSP: 0.5000 mL | Freq: Once | INTRAMUSCULAR | Status: DC

## 2017-06-14 MED ORDER — ZOLPIDEM TARTRATE 5 MG PO TABS: 5.0000 mg | ORAL_TABLET | Freq: Every evening | ORAL | Status: DC | PRN

## 2017-06-14 NOTE — Progress Notes (Signed)
Patient reports feeling dizzy, Vitals WNL she has not eaten since early AM,  instructed to do so

## 2017-06-14 NOTE — Anesthesia Postprocedure Evaluation (Signed)
Anesthesia Post Note  Patient: Cheryl [name]  Procedure(s) Performed: AN AD HOC LABOR EPIDURAL     Patient location during evaluation: Mother Baby Anesthesia Type: Epidural Level of consciousness: awake, awake and alert, oriented and patient cooperative Pain management: pain level controlled Vital Signs Assessment: post-procedure vital signs reviewed and stable Respiratory status: spontaneous breathing, nonlabored ventilation and respiratory function stable Cardiovascular status: stable Postop Assessment: no headache, no backache, patient able to bend at knees and no apparent nausea or vomiting Anesthetic complications: no    Last Vitals:  Vitals:   06/14/17 0200 06/14/17 0520  BP: (!) 118/51 (!) 130/55  Pulse: (!) 103 88  Resp: 18 16  Temp: 36.7 C 36.7 C  SpO2:      Last Pain:  Vitals:   06/14/17 0520  TempSrc:   PainSc: 0-No pain   Pain Goal: Patients Stated Pain Goal: 0 (06/13/17 0959)               Orva Riles L

## 2017-06-14 NOTE — Progress Notes (Signed)
Postpartum Note Day # 1  S:  Patient resting comfortable in bed.  Pain controlled.  Tolerating general diet. No flatus, no BM.  Lochia moderate.  Ambulating without difficulty.  She denies n/v/f/c, SOB, or CP.  O: Temp:  [97.9 F (36.6 C)-98.7 F (37.1 C)] 98 F (36.7 C) (11/09 0520) Pulse Rate:  [84-230] 88 (11/09 0520) Resp:  [16-18] 16 (11/09 0520) BP: (92-160)/(23-116) 130/55 (11/09 0520) SpO2:  [100 %] 100 % (11/08 1000) Weight:  [112 kg (247 lb)] 112 kg (247 lb) (11/08 1000)   Gen: A&Ox3, NAD CV: RRR Resp: CTAB Abdomen: obese, soft, NT, ND Uterus: firm, non-tender, below umbilicus Ext: 1+ non-pitting edema, no calf tenderness bilaterally  Labs:  CBC Latest Ref Rng & Units 06/13/2017 05/30/2017 05/15/2017  WBC 4.0 - 10.5 K/uL 10.3 11.9(H) 12.1(H)  Hemoglobin 12.0 - 15.0 g/dL 11.5(L) 11.2(L) 11.1(L)  Hematocrit 36.0 - 46.0 % 34.9(L) 33.9(L) 32.8(L)  Platelets 150 - 400 K/uL 225 245 201    A/P: Pt is a 26 y.o. G2P2002 s/p NSVD, PPD#1  - Pain well controlled -GU: voiding freely -GI: Tolerating general diet -Activity: encouraged sitting up to chair and ambulation as tolerated -Prophylaxis: early ambulation -Labs: stable as above -anxiety with panic attacks: required ativan during labor, no baseline medication currently, plan for 2wk follow up -continue with routine postpartum care  Myna HidalgoJennifer Pranathi Winfree, DO [phone] (pager) [phone] (office)

## 2017-06-14 NOTE — Lactation Note (Signed)
This note was copied from a baby's chart. Lactation Consultation Note Baby 5 hrs old, mom states baby wanting to constantly feed. Baby fussing when not on the breast. FOB holding baby, LC came in, mom stated she would latch the baby. Mom had baby laying in her lap on a pillow bending over baby, pulling nipple to baby. Discussed positioning, options, props and support. Mom stated the football hold never worked well for her.  Mom has a 803 yr old that she BF for 10 months.  Mom has pendulous long breast w/nipple at the bottom end of breast. Rt. Nipple more everted than Lt.which is very short shaft, almost flat. Very compressible. Mom had her finger tips at the nipple trying to get it into the baby's mouth. Suggested to put fingers behind areola.  Mom encouraged to feed baby 8-12 times/24 hours and with feeding cues. Discussed newborn feeding habits and behavior. Encouraged STS, I&O. Asked mom to call for assistance if needed.  [facility] brochure given w/resources, support groups and LC services. Patient Name: Cheryl Constance GoltzChelsea [name] ZOXWR'UToday's Date: 06/14/2017 Reason for consult: Initial assessment   Maternal Data Has patient been taught Hand Expression?: Yes Does the patient have breastfeeding experience prior to this delivery?: Yes  Feeding Feeding Type: Breast Fed Length of feed: 10 min  LATCH Score Latch: Repeated attempts needed to sustain latch, nipple held in mouth throughout feeding, stimulation needed to elicit sucking reflex.  Audible Swallowing: A few with stimulation  Type of Nipple: Everted at rest and after stimulation(semi flat)  Comfort (Breast/Nipple): Soft / non-tender  Hold (Positioning): Assistance needed to correctly position infant at breast and maintain latch.  LATCH Score: 7  Interventions Interventions: Breast feeding basics reviewed;Assisted with latch;Breast compression;Skin to skin;Adjust position;Support pillows;Hand express;Position options  Lactation Tools  Discussed/Used     Consult Status Consult Status: Follow-up Date: 06/15/17 Follow-up type: In-patient    Charyl DancerCARVER, Lelynd Poer G 06/14/2017, 4:58 AM

## 2017-06-15 MED ORDER — IBUPROFEN 600 MG PO TABS: 600.0000 mg | ORAL_TABLET | Freq: Four times a day (QID) | ORAL | 0 refills | Status: AC

## 2017-06-15 NOTE — Discharge Summary (Signed)
OB Discharge Summary     Patient Name: Cheryl [name] DOB: Jun 29, 1991 MRN: [id number]  Date of admission: 06/13/2017 Delivering MD: Illene BolusLEMMONS, Pastor Sgro A   Date of discharge: 06/15/2017  Admitting diagnosis: 39WKS, DECREASE FM Intrauterine pregnancy: 807w1d     Secondary diagnosis:  Active Problems:   Indication for care in labor or delivery  Additional problems: Anxiety     Discharge diagnosis: Term Pregnancy Delivered                                                                                                Post partum procedures:  Augmentation:   Complications: None  Hospital course:  Induction of Labor With Vaginal Delivery   26 y.o. yo G2P2002 at [redacted]w[redacted]d was admitted to the hospital 06/13/2017 for induction of labor.  Indication for induction: decreased fetal movement.  Patient had an uncomplicated labor course as follows: Membrane Rupture Time/Date: 4:31 PM ,06/13/2017   Intrapartum Procedures: Episiotomy: None [1]                                         Lacerations:  None [1]  Patient had delivery of a Viable infant.  Information for the patient's newborn:  Alexis FrockWoodsend, Girl Cris [[id number]][[id number]]  Delivery Method: Vag-Spont   06/13/2017  Details of delivery can be found in separate delivery note.  Patient had a routine postpartum course. Patient is discharged home 06/15/17.  Physical exam  Vitals:   06/14/17 0520 06/14/17 1342 06/14/17 1827 06/15/17 0542  BP: (!) 130/55 112/69 130/73 109/63  Pulse: 88 78 83 80  Resp: 16  16 17   Temp: 98 F (36.7 C)  97.9 F (36.6 C) 98 F (36.7 C)  TempSrc:   Oral Oral  SpO2:    100%  Weight:      Height:       General: alert, cooperative and no distress Lochia: appropriate Uterine Fundus: firm Incision:  DVT Evaluation: No evidence of DVT seen on physical exam. Labs: Lab Results  Component Value Date   WBC 13.6 (H) 06/14/2017   HGB 9.5 (L) 06/14/2017   HCT 28.2 (L) 06/14/2017   MCV 81.7 06/14/2017   PLT 186  06/14/2017   CMP Latest Ref Rng & Units 05/30/2017  Glucose 65 - 99 mg/dL 97  BUN 6 - 20 mg/dL 5(L)  Creatinine 0.340.44 - 1.00 mg/dL 7.420.51  Sodium 595135 - 638145 mmol/L 136  Potassium 3.5 - 5.1 mmol/L 3.5  Chloride 101 - 111 mmol/L 105  CO2 22 - 32 mmol/L 23  Calcium 8.9 - 10.3 mg/dL 7.5(I8.7(L)  Total Protein 6.5 - 8.1 g/dL 6.6  Total Bilirubin 0.3 - 1.2 mg/dL 0.3  Alkaline Phos 38 - 126 U/L 89  AST 15 - 41 U/L 15  ALT 14 - 54 U/L 14    Discharge instruction: per After Visit Summary and "Baby and Me Booklet".  After visit meds:    Diet: routine diet  Activity: Advance as tolerated. Pelvic rest for 6 weeks.  Outpatient follow up:2 weeks Follow up Appt:No future appointments. Follow up Visit:No Follow-up on file.  Postpartum contraception: Not Discussed  Newborn Data: Live born female  Birth Weight: 6 lb 10.5 oz (3019 g) APGAR: 9, 9  Newborn Delivery   Birth date/time:  06/13/2017 22:27:00 Delivery type:  Vaginal, Spontaneous     Baby Feeding: Breast Disposition:home with mother   06/15/2017 Rhea PinkLori A Justina Bertini, CNM

## 2017-06-15 NOTE — Discharge Instructions (Signed)
Home Care Instructions for Mom °ACTIVITY °· Gradually return to your regular activities. °· Let yourself rest. Nap while your baby sleeps. °· Avoid lifting anything that is heavier than 10 lb (4.5 kg) until your health care provider says it is okay. °· Avoid activities that take a lot of effort and energy (are strenuous) until approved by your health care provider. Walking at a slow-to-moderate pace is usually safe. °· If you had a cesarean delivery: °? Do not vacuum, climb stairs, or drive a car for 4-6 weeks. °? Have someone help you at home until you feel like you can do your usual activities yourself. °? Do exercises as told by your health care provider, if this applies. ° °VAGINAL BLEEDING °You may continue to bleed for 4-6 weeks after delivery. Over time, the amount of blood usually decreases and the color of the blood usually gets lighter. However, the flow of bright red blood may increase if you have been too active. If you need to use more than one pad in an hour because your pad gets soaked, or if you pass a large clot: °· Lie down. °· Raise your feet. °· Place a cold compress on your lower abdomen. °· Rest. °· Call your health care provider. ° °If you are breastfeeding, your period should return anytime between 8 weeks after delivery and the time that you stop breastfeeding. If you are not breastfeeding, your period should return 6-8 weeks after delivery. °PERINEAL CARE °The perineal area, or perineum, is the part of your body between your thighs. After delivery, this area needs special care. Follow these instructions as told by your health care provider. °· Take warm tub baths for 15-20 minutes. °· Use medicated pads and pain-relieving sprays and creams as told. °· Do not use tampons or douches until vaginal bleeding has stopped. °· Each time you go to the bathroom: °? Use a peri bottle. °? Change your pad. °? Use towelettes in place of toilet paper until your stitches have healed. °· Do Kegel exercises  every day. Kegel exercises help to maintain the muscles that support the vagina, bladder, and bowels. You can do these exercises while you are standing, sitting, or lying down. To do Kegel exercises: °? Tighten the muscles of your abdomen and the muscles that surround your birth canal. °? Hold for a few seconds. °? Relax. °? Repeat until you have done this 5 times in a row. °· To prevent hemorrhoids from developing or getting worse: °? Drink enough fluid to keep your urine clear or pale yellow. °? Avoid straining when having a bowel movement. °? Take over-the-counter medicines and stool softeners as told by your health care provider. ° °BREAST CARE °· Wear a tight-fitting bra. °· Avoid taking over-the-counter pain medicine for breast discomfort. °· Apply ice to the breasts to help with discomfort as needed: °? Put ice in a plastic bag. °? Place a towel between your skin and the bag. °? Leave the ice on for 20 minutes or as told by your health care provider. ° °NUTRITION °· Eat a well-balanced diet. °· Do not try to lose weight quickly by cutting back on calories. °· Take your prenatal vitamins until your postpartum checkup or until your health care provider tells you to stop. ° °POSTPARTUM DEPRESSION °You may find yourself crying for no apparent reason and unable to cope with all of the changes that come with having a newborn. This mood is called postpartum depression. Postpartum depression happens because your hormone   levels change after delivery. If you have postpartum depression, get support from your partner, friends, and family. If the depression does not go away on its own after several weeks, contact your health care provider. BREAST SELF-EXAM Do a breast self-exam each month, at the same time of the month. If you are breastfeeding, check your breasts just after a feeding, when your breasts are less full. If you are breastfeeding and your period has started, check your breasts on day 5, 6, or 7 of your  period. Report any lumps, bumps, or discharge to your health care provider. Know that breasts are normally lumpy if you are breastfeeding. This is temporary, and it is not a health risk. INTIMACY AND SEXUALITY Avoid sexual activity for at least 3-4 weeks after delivery or until the brownish-red vaginal flow is completely gone. If you want to avoid pregnancy, use some form of birth control. You can get pregnant after delivery, even if you have not had your period. SEEK MEDICAL CARE IF:  You feel unable to cope with the changes that a child brings to your life, and these feelings do not go away after several weeks.  You notice a lump, a bump, or discharge on your breast.  SEEK IMMEDIATE MEDICAL CARE IF:  Blood soaks your pad in 1 hour or less.  You have: ? Severe pain or cramping in your lower abdomen. ? A bad-smelling vaginal discharge. ? A fever that is not controlled by medicine. ? A fever, and an area of your breast is red and sore. ? Pain or redness in your calf. ? Sudden, severe chest pain. ? Shortness of breath. ? Painful or bloody urination. ? Problems with your vision.  You vomit for 12 hours or longer.  You develop a severe headache.  You have serious thoughts about hurting yourself, your child, or anyone else.  This information is not intended to replace advice given to you by your health care provider. Make sure you discuss any questions you have with your health care provider. Document Released: 07/20/2000 Document Revised: 12/29/2015 Document Reviewed: 01/24/2015 Elsevier Interactive Patient Education  2017 [employer] Postpartum Depression and Baby Blues The postpartum period begins right after the birth of a baby. During this time, there is often a great amount of joy and excitement. It is also a time of many changes in the life of the parents. Regardless of how many times a mother gives birth, each child brings new challenges and dynamics to the family. It is not  unusual to have feelings of excitement along with confusing shifts in moods, emotions, and thoughts. All mothers are at risk of developing postpartum depression or the "baby blues." These mood changes can occur right after giving birth, or they may occur many months after giving birth. The baby blues or postpartum depression can be mild or severe. Additionally, postpartum depression can go away rather quickly, or it can be a long-term condition. What are the causes? Raised hormone levels and the rapid drop in those levels are thought to be a main cause of postpartum depression and the baby blues. A number of hormones change during and after pregnancy. Estrogen and progesterone usually decrease right after the delivery of your baby. The levels of thyroid hormone and various cortisol steroids also rapidly drop. Other factors that play a role in these mood changes include major life events and genetics. What increases the risk? If you have any of the following risks for the baby blues or postpartum depression, know  what symptoms to watch out for during the postpartum period. Risk factors that may increase the likelihood of getting the baby blues or postpartum depression include:  Having a personal or family history of depression.  Having depression while being pregnant.  Having premenstrual mood issues or mood issues related to oral contraceptives.  Having a lot of life stress.  Having marital conflict.  Lacking a social support network.  Having a baby with special needs.  Having health problems, such as diabetes.  What are the signs or symptoms? Symptoms of baby blues include:  Brief changes in mood, such as going from extreme happiness to sadness.  Decreased concentration.  Difficulty sleeping.  Crying spells, tearfulness.  Irritability.  Anxiety.  Symptoms of postpartum depression typically begin within the first month after giving birth. These symptoms include:  Difficulty  sleeping or excessive sleepiness.  Marked weight loss.  Agitation.  Feelings of worthlessness.  Lack of interest in activity or food.  Postpartum psychosis is a very serious condition and can be dangerous. Fortunately, it is rare. Displaying any of the following symptoms is cause for immediate medical attention. Symptoms of postpartum psychosis include:  Hallucinations and delusions.  Bizarre or disorganized behavior.  Confusion or disorientation.  How is this diagnosed? A diagnosis is made by an evaluation of your symptoms. There are no medical or lab tests that lead to a diagnosis, but there are various questionnaires that a health care provider may use to identify those with the baby blues, postpartum depression, or psychosis. Often, a screening tool called the [place] Postnatal Depression Scale is used to diagnose depression in the postpartum period. How is this treated? The baby blues usually goes away on its own in 1-2 weeks. Social support is often all that is needed. You will be encouraged to get adequate sleep and rest. Occasionally, you may be given medicines to help you sleep. Postpartum depression requires treatment because it can last several months or longer if it is not treated. Treatment may include individual or group therapy, medicine, or both to address any social, physiological, and psychological factors that may play a role in the depression. Regular exercise, a healthy diet, rest, and social support may also be strongly recommended. Postpartum psychosis is more serious and needs treatment right away. Hospitalization is often needed. Follow these instructions at home:  Get as much rest as you can. Nap when the baby sleeps.  Exercise regularly. Some women find yoga and walking to be beneficial.  Eat a balanced and nourishing diet.  Do little things that you enjoy. Have a cup of tea, take a bubble bath, read your favorite magazine, or listen to your favorite  music.  Avoid alcohol.  Ask for help with household chores, cooking, grocery shopping, or running errands as needed. Do not try to do everything.  Talk to people close to you about how you are feeling. Get support from your partner, family members, friends, or other new moms.  Try to stay positive in how you think. Think about the things you are grateful for.  Do not spend a lot of time alone.  Only take over-the-counter or prescription medicine as directed by your health care provider.  Keep all your postpartum appointments.  Let your health care provider know if you have any concerns. Contact a health care provider if: You are having a reaction to or problems with your medicine. Get help right away if:  You have suicidal feelings.  You think you may harm the baby  or someone else. This information is not intended to replace advice given to you by your health care provider. Make sure you discuss any questions you have with your health care provider. Document Released: 04/26/2004 Document Revised: 12/29/2015 Document Reviewed: 05/04/2013 Elsevier Interactive Patient Education  2017 [employer]

## 2017-06-15 NOTE — Lactation Note (Signed)
This note was copied from a baby's chart. Lactation Consultation Note  Patient Name: Cheryl [name] ZConstance GoltzOXWR'UToday's Date: 06/15/2017   Attempted consult with mom several times, but baby having hearing screen and others in and out of the room preparing mom and baby for D/C. Mom aware of OP/BFSG and LC phone line assistance after D/C.   Maternal Data    Feeding    LATCH Score                   Interventions    Lactation Tools Discussed/Used     Consult Status      Sherlyn HayJennifer D Amiir Heckard 06/15/2017, 12:03 PM

## 2017-06-19 ENCOUNTER — Inpatient Hospital Stay (HOSPITAL_COMMUNITY): Admission: RE | Admit: 2017-06-19 | Payer: BLUE CROSS/BLUE SHIELD | Source: Ambulatory Visit

## 2017-06-28 ENCOUNTER — Telehealth: Payer: Self-pay

## 2017-06-28 NOTE — Telephone Encounter (Signed)
Patient call reporting increased vaginal bleeding and dizziness.  Patient states bleeding had almost subsided, but noticed that it has increased throughout the day.  Patient also states she feels "gushes" that result in dizziness, but denies passing of large clots.  Patient denies frequent pad changes and states "I change them every 2-3 hours because it feels gross."  Patient admits to increased activity today and reminded that she is still 2 weeks postpartum and should not be taking on too many physical activities.  Patient further encouraged to hydrate and rest in regards to dizziness.  Patient verbalized understanding.  Instructed to call back if dizziness continues tomorrow or if bleeding increases at any time.  No other q/c. JE, CNM

## 2017-08-01 ENCOUNTER — Other Ambulatory Visit (HOSPITAL_COMMUNITY)
Admission: RE | Admit: 2017-08-01 | Discharge: 2017-08-01 | Disposition: A | Discharge status: Home / Self Care | Payer: BLUE CROSS/BLUE SHIELD | Source: Ambulatory Visit | Attending: Obstetrics and Gynecology | Admitting: Obstetrics and Gynecology

## 2017-08-01 ENCOUNTER — Other Ambulatory Visit: Payer: Self-pay | Admitting: Obstetrics and Gynecology

## 2017-08-01 ENCOUNTER — Inpatient Hospital Stay: Admission: RE | Admit: 2017-08-01 | Payer: Self-pay | Source: Ambulatory Visit

## 2017-08-01 DIAGNOSIS — Z01419 Encounter for gynecological examination (general) (routine) without abnormal findings: Secondary | ICD-10-CM | POA: Insufficient documentation

## 2017-08-05 LAB — CYTOLOGY - PAP: DIAGNOSIS: NEGATIVE

## 2017-08-09 ENCOUNTER — Other Ambulatory Visit: Payer: Self-pay | Admitting: Gastroenterology

## 2017-08-09 DIAGNOSIS — R160 Hepatomegaly, not elsewhere classified: Secondary | ICD-10-CM

## 2017-08-13 DIAGNOSIS — L821 Other seborrheic keratosis: Secondary | ICD-10-CM | POA: Diagnosis not present

## 2017-08-13 DIAGNOSIS — D225 Melanocytic nevi of trunk: Secondary | ICD-10-CM | POA: Diagnosis not present

## 2017-08-13 DIAGNOSIS — L814 Other melanin hyperpigmentation: Secondary | ICD-10-CM | POA: Diagnosis not present

## 2017-08-13 DIAGNOSIS — L218 Other seborrheic dermatitis: Secondary | ICD-10-CM | POA: Diagnosis not present

## 2017-10-25 ENCOUNTER — Other Ambulatory Visit: Payer: BLUE CROSS/BLUE SHIELD

## 2017-10-29 ENCOUNTER — Ambulatory Visit
Admission: RE | Admit: 2017-10-29 | Discharge: 2017-10-29 | Disposition: A | Discharge status: Home / Self Care | Payer: BLUE CROSS/BLUE SHIELD | Source: Ambulatory Visit | Attending: Gastroenterology | Admitting: Gastroenterology

## 2017-10-29 DIAGNOSIS — K769 Liver disease, unspecified: Secondary | ICD-10-CM | POA: Diagnosis not present

## 2017-10-29 DIAGNOSIS — R160 Hepatomegaly, not elsewhere classified: Secondary | ICD-10-CM

## 2017-11-15 DIAGNOSIS — K769 Liver disease, unspecified: Secondary | ICD-10-CM | POA: Diagnosis not present

## 2017-11-15 DIAGNOSIS — R1011 Right upper quadrant pain: Secondary | ICD-10-CM | POA: Diagnosis not present

## 2017-11-15 DIAGNOSIS — K219 Gastro-esophageal reflux disease without esophagitis: Secondary | ICD-10-CM | POA: Diagnosis not present

## 2017-11-18 ENCOUNTER — Other Ambulatory Visit: Payer: Self-pay | Admitting: Gastroenterology

## 2017-11-18 DIAGNOSIS — R1011 Right upper quadrant pain: Secondary | ICD-10-CM

## 2017-11-30 ENCOUNTER — Ambulatory Visit
Admission: RE | Admit: 2017-11-30 | Discharge: 2017-11-30 | Disposition: A | Discharge status: Home / Self Care | Payer: BLUE CROSS/BLUE SHIELD | Source: Ambulatory Visit | Attending: Gastroenterology | Admitting: Gastroenterology

## 2017-11-30 DIAGNOSIS — K769 Liver disease, unspecified: Secondary | ICD-10-CM | POA: Diagnosis not present

## 2017-11-30 DIAGNOSIS — R1011 Right upper quadrant pain: Secondary | ICD-10-CM

## 2017-11-30 MED ORDER — GADOBENATE DIMEGLUMINE 529 MG/ML IV SOLN
20.0000 mL | Freq: Once | INTRAVENOUS | Status: AC | PRN
  Administered 2017-11-30: 20 mL via INTRAVENOUS

## 2017-12-06 DIAGNOSIS — Z30430 Encounter for insertion of intrauterine contraceptive device: Secondary | ICD-10-CM | POA: Diagnosis not present

## 2018-01-17 ENCOUNTER — Other Ambulatory Visit: Payer: Self-pay | Admitting: Obstetrics and Gynecology

## 2018-01-17 ENCOUNTER — Other Ambulatory Visit (HOSPITAL_COMMUNITY)
Admission: RE | Admit: 2018-01-17 | Discharge: 2018-01-17 | Disposition: A | Discharge status: Home / Self Care | Payer: BLUE CROSS/BLUE SHIELD | Source: Ambulatory Visit | Attending: Obstetrics and Gynecology | Admitting: Obstetrics and Gynecology

## 2018-01-17 DIAGNOSIS — Z975 Presence of (intrauterine) contraceptive device: Secondary | ICD-10-CM | POA: Diagnosis not present

## 2018-01-17 DIAGNOSIS — T8384XA Pain from genitourinary prosthetic devices, implants and grafts, initial encounter: Secondary | ICD-10-CM | POA: Diagnosis not present

## 2018-01-17 DIAGNOSIS — Z30432 Encounter for removal of intrauterine contraceptive device: Secondary | ICD-10-CM | POA: Diagnosis not present

## 2018-01-21 LAB — CYTOLOGY - PAP
DIAGNOSIS: REACTIVE — AB
Diagnosis: NONE SEEN — AB

## 2018-02-03 DIAGNOSIS — R638 Other symptoms and signs concerning food and fluid intake: Secondary | ICD-10-CM | POA: Diagnosis not present

## 2018-02-03 DIAGNOSIS — E282 Polycystic ovarian syndrome: Secondary | ICD-10-CM | POA: Diagnosis not present

## 2018-02-03 DIAGNOSIS — Z30430 Encounter for insertion of intrauterine contraceptive device: Secondary | ICD-10-CM | POA: Diagnosis not present

## 2018-02-20 DIAGNOSIS — E282 Polycystic ovarian syndrome: Secondary | ICD-10-CM | POA: Diagnosis not present

## 2018-02-20 DIAGNOSIS — R638 Other symptoms and signs concerning food and fluid intake: Secondary | ICD-10-CM | POA: Diagnosis not present

## 2018-03-07 DIAGNOSIS — E785 Hyperlipidemia, unspecified: Secondary | ICD-10-CM | POA: Diagnosis not present

## 2018-03-07 DIAGNOSIS — Z6841 Body Mass Index (BMI) 40.0 and over, adult: Secondary | ICD-10-CM | POA: Diagnosis not present

## 2018-04-03 DIAGNOSIS — Z30431 Encounter for routine checking of intrauterine contraceptive device: Secondary | ICD-10-CM | POA: Diagnosis not present

## 2018-04-16 DIAGNOSIS — E282 Polycystic ovarian syndrome: Secondary | ICD-10-CM | POA: Diagnosis not present

## 2018-04-16 DIAGNOSIS — Z6841 Body Mass Index (BMI) 40.0 and over, adult: Secondary | ICD-10-CM | POA: Diagnosis not present

## 2018-04-23 DIAGNOSIS — Z713 Dietary counseling and surveillance: Secondary | ICD-10-CM | POA: Diagnosis not present

## 2018-05-12 ENCOUNTER — Other Ambulatory Visit: Payer: Self-pay | Admitting: Gastroenterology

## 2018-05-12 DIAGNOSIS — K769 Liver disease, unspecified: Secondary | ICD-10-CM

## 2018-05-19 DIAGNOSIS — D225 Melanocytic nevi of trunk: Secondary | ICD-10-CM | POA: Diagnosis not present

## 2018-05-19 DIAGNOSIS — D485 Neoplasm of uncertain behavior of skin: Secondary | ICD-10-CM | POA: Diagnosis not present

## 2018-05-19 DIAGNOSIS — Z840 Family history of diseases of the skin and subcutaneous tissue: Secondary | ICD-10-CM | POA: Diagnosis not present

## 2018-05-19 DIAGNOSIS — L905 Scar conditions and fibrosis of skin: Secondary | ICD-10-CM | POA: Diagnosis not present

## 2018-06-06 DIAGNOSIS — N6320 Unspecified lump in the left breast, unspecified quadrant: Secondary | ICD-10-CM | POA: Diagnosis not present

## 2018-06-17 ENCOUNTER — Other Ambulatory Visit: Payer: BLUE CROSS/BLUE SHIELD

## 2018-07-15 DIAGNOSIS — R5383 Other fatigue: Secondary | ICD-10-CM | POA: Diagnosis not present

## 2018-07-15 DIAGNOSIS — Z01419 Encounter for gynecological examination (general) (routine) without abnormal findings: Secondary | ICD-10-CM | POA: Diagnosis not present

## 2018-07-15 DIAGNOSIS — R638 Other symptoms and signs concerning food and fluid intake: Secondary | ICD-10-CM | POA: Diagnosis not present

## 2018-07-15 DIAGNOSIS — Z638 Other specified problems related to primary support group: Secondary | ICD-10-CM | POA: Diagnosis not present

## 2018-07-20 DIAGNOSIS — R509 Fever, unspecified: Secondary | ICD-10-CM | POA: Diagnosis not present

## 2018-07-20 DIAGNOSIS — H6692 Otitis media, unspecified, left ear: Secondary | ICD-10-CM | POA: Diagnosis not present

## 2018-08-01 ENCOUNTER — Inpatient Hospital Stay
Admission: RE | Admit: 2018-08-01 | Discharge: 2018-08-01 | Disposition: A | Discharge status: Home / Self Care | Payer: BLUE CROSS/BLUE SHIELD | Source: Ambulatory Visit | Attending: Gastroenterology | Admitting: Gastroenterology

## 2018-08-08 DIAGNOSIS — R0789 Other chest pain: Secondary | ICD-10-CM | POA: Diagnosis not present

## 2018-08-08 DIAGNOSIS — R000 Tachycardia, unspecified: Secondary | ICD-10-CM | POA: Diagnosis not present

## 2018-08-09 DIAGNOSIS — R000 Tachycardia, unspecified: Secondary | ICD-10-CM | POA: Diagnosis not present

## 2018-08-12 DIAGNOSIS — J019 Acute sinusitis, unspecified: Secondary | ICD-10-CM | POA: Diagnosis not present

## 2018-08-18 DIAGNOSIS — F4322 Adjustment disorder with anxiety: Secondary | ICD-10-CM | POA: Diagnosis not present

## 2018-09-01 DIAGNOSIS — F4322 Adjustment disorder with anxiety: Secondary | ICD-10-CM | POA: Diagnosis not present

## 2018-09-03 DIAGNOSIS — N888 Other specified noninflammatory disorders of cervix uteri: Secondary | ICD-10-CM | POA: Diagnosis not present

## 2018-09-03 DIAGNOSIS — R102 Pelvic and perineal pain: Secondary | ICD-10-CM | POA: Diagnosis not present

## 2018-09-10 DIAGNOSIS — N949 Unspecified condition associated with female genital organs and menstrual cycle: Secondary | ICD-10-CM | POA: Diagnosis not present

## 2018-09-10 DIAGNOSIS — R102 Pelvic and perineal pain: Secondary | ICD-10-CM | POA: Diagnosis not present

## 2018-09-10 DIAGNOSIS — Z30431 Encounter for routine checking of intrauterine contraceptive device: Secondary | ICD-10-CM | POA: Diagnosis not present

## 2018-09-15 DIAGNOSIS — F4322 Adjustment disorder with anxiety: Secondary | ICD-10-CM | POA: Diagnosis not present

## 2018-09-16 DIAGNOSIS — R030 Elevated blood-pressure reading, without diagnosis of hypertension: Secondary | ICD-10-CM | POA: Diagnosis not present

## 2018-10-02 DIAGNOSIS — F340 Cyclothymic disorder: Secondary | ICD-10-CM | POA: Diagnosis not present

## 2018-10-03 DIAGNOSIS — F340 Cyclothymic disorder: Secondary | ICD-10-CM | POA: Diagnosis not present

## 2018-10-14 DIAGNOSIS — F340 Cyclothymic disorder: Secondary | ICD-10-CM | POA: Diagnosis not present

## 2018-11-03 DIAGNOSIS — F340 Cyclothymic disorder: Secondary | ICD-10-CM | POA: Diagnosis not present

## 2018-11-21 ENCOUNTER — Other Ambulatory Visit: Payer: Self-pay | Admitting: Gastroenterology

## 2018-11-21 DIAGNOSIS — K769 Liver disease, unspecified: Secondary | ICD-10-CM

## 2018-11-21 DIAGNOSIS — K219 Gastro-esophageal reflux disease without esophagitis: Secondary | ICD-10-CM | POA: Diagnosis not present

## 2018-12-01 DIAGNOSIS — F340 Cyclothymic disorder: Secondary | ICD-10-CM | POA: Diagnosis not present

## 2018-12-02 ENCOUNTER — Other Ambulatory Visit: Payer: Self-pay

## 2018-12-02 ENCOUNTER — Ambulatory Visit
Admission: RE | Admit: 2018-12-02 | Discharge: 2018-12-02 | Disposition: A | Discharge status: Home / Self Care | Payer: BLUE CROSS/BLUE SHIELD | Source: Ambulatory Visit | Attending: Gastroenterology | Admitting: Gastroenterology

## 2018-12-02 DIAGNOSIS — K769 Liver disease, unspecified: Secondary | ICD-10-CM

## 2018-12-02 DIAGNOSIS — R200 Anesthesia of skin: Secondary | ICD-10-CM | POA: Diagnosis not present

## 2018-12-02 MED ORDER — GADOBENATE DIMEGLUMINE 529 MG/ML IV SOLN
20.0000 mL | Freq: Once | INTRAVENOUS | Status: AC | PRN
  Administered 2018-12-02: 20 mL via INTRAVENOUS

## 2018-12-03 ENCOUNTER — Telehealth: Payer: Self-pay | Admitting: *Deleted

## 2018-12-03 ENCOUNTER — Encounter: Payer: Self-pay | Admitting: *Deleted

## 2018-12-03 NOTE — Telephone Encounter (Signed)
Spoke with patient and updated EMR prior to video visit tomorrow.

## 2018-12-04 ENCOUNTER — Ambulatory Visit (INDEPENDENT_AMBULATORY_CARE_PROVIDER_SITE_OTHER): Payer: BLUE CROSS/BLUE SHIELD | Admitting: Diagnostic Neuroimaging

## 2018-12-04 ENCOUNTER — Encounter: Payer: Self-pay | Admitting: Diagnostic Neuroimaging

## 2018-12-04 ENCOUNTER — Other Ambulatory Visit: Payer: Self-pay

## 2018-12-04 DIAGNOSIS — R200 Anesthesia of skin: Secondary | ICD-10-CM | POA: Diagnosis not present

## 2018-12-04 NOTE — Progress Notes (Signed)
[facility]  PATIENT: Cheryl [name] DOB: [birth year]  REFERRING CLINICIAN: A Morrow HISTORY FROM: patient  REASON FOR VISIT: new consult    HISTORICAL  CHIEF COMPLAINT:  Chief Complaint  Patient presents with   Numbness    HISTORY OF PRESENT ILLNESS:   28 year old female here for hours of left face numbness.  1 month ago patient had onset of left face numbness around her left maxillary region.  This was intermittent initially and has gradually progressed over time.  Now she feels more constant numbness sensation on her left cheek and maxillary region.  Also today she noticed some numbness in her left arm proximally down to her left hand and fingertips.  Patient denies any headache, vision problems, slurred speech, facial weakness, arm or leg weakness.  No balance or walking difficulties.  No prodromal accidents injuries or traumas.  Patient has history of hypercholesterolemia, recently restarted on metformin.   REVIEW OF SYSTEMS: Full 14 system review of systems performed and negative with exception of: As per HPI.  ALLERGIES: Allergies  Allergen Reactions   Amoxicillin Nausea And Vomiting   Effexor [Venlafaxine] Hives   Erythromycin Itching    swelling   Oxycontin [Oxycodone Hcl] Itching   Vicodin [Hydrocodone-Acetaminophen] Itching   Bupropion Anxiety   Mold Extract [Trichophyton] Rash    HOME MEDICATIONS: Outpatient Medications Prior to Visit  Medication Sig Dispense Refill   acetaminophen (TYLENOL) 325 MG tablet Take 650 mg by mouth every 6 (six) hours as needed for headache.      calcium carbonate (TUMS - DOSED IN MG ELEMENTAL CALCIUM) 500 MG chewable tablet Chew 2-4 tablets by mouth daily as needed for indigestion or heartburn.      Cholecalciferol (D3-50) 1.25 MG (50000 UT) capsule Take by mouth.     famotidine (PEPCID) 20 MG tablet Take 20 mg by mouth at bedtime.     hydrOXYzine (VISTARIL) 50 MG capsule TAKE 1 CAPSULE BY  MOUTH EVERY 6 HOURS AS NEEDED     ibuprofen (ADVIL,MOTRIN) 600 MG tablet Take 1 tablet (600 mg total) every 6 (six) hours by mouth. 30 tablet 0   LATUDA 20 MG TABS tablet TAKE 1 2 (ONE HALF) TABLET BY MOUTH ONCE DAILY FOR 4 DAYS AND 1 TABLETS ONCE DAILY FOR 4 DAYS AND 2 TABLETS ONCE DAILY TAKE WITH 350 CAL. ME     LATUDA 40 MG TABS tablet 12/03/18 not started yet     Melatonin (MELATONIN MAXIMUM STRENGTH) 5 MG TABS Take by mouth as needed.     metFORMIN (GLUCOPHAGE) 500 MG tablet Take 500 mg by mouth daily.     pantoprazole (PROTONIX) 40 MG tablet Take 40 mg daily by mouth.     Prenatal MV-Min-FA-Omega-3 (PRENATAL GUMMIES/DHA & FA PO) Take 2 tablets by mouth daily.     No facility-administered medications prior to visit.     PAST MEDICAL HISTORY: Past Medical History:  Diagnosis Date   Anxiety    Chiari malformation type I (HCC)    Gallbladder attack    GERD (gastroesophageal reflux disease)    Hyperlipidemia    Insomnia    Liver cyst    Medical history non-contributory    PCOS (polycystic ovarian syndrome)     PAST SURGICAL HISTORY: Past Surgical History:  Procedure Laterality Date   TONSILLECTOMY  2015    FAMILY HISTORY: Family History  Problem Relation Age of Onset   Diabetes Maternal Grandmother    Cancer Maternal Grandmother    Hyperlipidemia Maternal Grandmother  Hyperlipidemia Paternal Grandmother    Diabetes Paternal Grandmother    Parkinson's disease Paternal Grandmother    Diabetes Paternal Grandfather    Hyperlipidemia Paternal Grandfather    Hypertension Mother    Depression Mother    Parkinson's disease Mother    Thyroid disease Mother    Diabetes Father    Hypertension Father    Birth defects Sister        heart defect brought on by labor    SOCIAL HISTORY: Social History   Socioeconomic History   Marital status: Married    Spouse name: Not on file   Number of children: 2   Years of education: Not on file     Highest education level: Some college, no degree  Occupational History    Comment: homemaker  Social Network engineer strain: Not on file   Food insecurity:    Worry: Not on file    Inability: Not on file   Transportation needs:    Medical: Not on file    Non-medical: Not on file  Tobacco Use   Smoking status: Former Smoker    Types: Cigarettes    Last attempt to quit: 12/03/2010    Years since quitting: 8.0   Smokeless tobacco: Never Used  Substance and Sexual Activity   Alcohol use: Yes    Alcohol/week: 3.0 standard drinks    Types: 3 Cans of beer per week    Comment: not while preg   Drug use: No   Sexual activity: Yes    Birth control/protection: None  Lifestyle   Physical activity:    Days per week: Not on file    Minutes per session: Not on file   Stress: Not on file  Relationships   Social connections:    Talks on phone: Not on file    Gets together: Not on file    Attends religious service: Not on file    Active member of club or organization: Not on file    Attends meetings of clubs or organizations: Not on file    Relationship status: Not on file   Intimate partner violence:    Fear of current or ex partner: Not on file    Emotionally abused: Not on file    Physically abused: Not on file    Forced sexual activity: Not on file  Other Topics Concern   Not on file  Social History Narrative   Lives with family   Caffeine- none     PHYSICAL EXAM  VIDEO EXAM  GENERAL EXAM/CONSTITUTIONAL:  Vitals: There were no vitals filed for this visit.  There is no height or weight on file to calculate BMI. Wt Readings from Last 3 Encounters:  06/13/17 247 lb (112 kg)  01/30/17 237 lb 1.9 oz (107.6 kg)  10/18/16 249 lb 0.6 oz (113 kg)     Patient is in no distress; well developed, nourished and groomed; neck is supple   NEUROLOGIC: MENTAL STATUS:  No flowsheet data found.  awake, alert, oriented to person, place and  time  recent and remote memory intact  normal attention and concentration  language fluent, comprehension intact, naming intact  fund of knowledge appropriate  CRANIAL NERVE:   2nd, 3rd, 4th, 6th - visual fields full to confrontation, extraocular muscles intact, no nystagmus  5th - facial sensation --> DECR IN LEFT V1 AND V2 TO LT  7th - facial strength symmetric  8th - hearing intact  11th - shoulder shrug symmetric  12th - tongue protrusion midline  MOTOR:   NO TREMOR; NO DRIFT IN BUE  SENSORY:   DECR SENS TO LT IN LEFT ARM  COORDINATION:   fine finger movements normal      DIAGNOSTIC DATA (LABS, IMAGING, TESTING) - I reviewed patient records, labs, notes, testing and imaging myself where available.  Lab Results  Component Value Date   WBC 13.6 (H) 06/14/2017   HGB 9.5 (L) 06/14/2017   HCT 28.2 (L) 06/14/2017   MCV 81.7 06/14/2017   PLT 186 06/14/2017      Component Value Date/Time   NA 136 05/30/2017 2110   K 3.5 05/30/2017 2110   CL 105 05/30/2017 2110   CO2 23 05/30/2017 2110   GLUCOSE 97 05/30/2017 2110   BUN 5 (L) 05/30/2017 2110   CREATININE 0.51 05/30/2017 2110   CALCIUM 8.7 (L) 05/30/2017 2110   PROT 6.6 05/30/2017 2110   ALBUMIN 3.1 (L) 05/30/2017 2110   AST 15 05/30/2017 2110   ALT 14 05/30/2017 2110   ALKPHOS 89 05/30/2017 2110   BILITOT 0.3 05/30/2017 2110   GFRNONAA >60 05/30/2017 2110   GFRAA >60 05/30/2017 2110   No results found for: CHOL, HDL, LDLCALC, LDLDIRECT, TRIG, CHOLHDL No results found for: NWGN5A No results found for: VITAMINB12 Lab Results  Component Value Date   TSH 1.580 12/04/2013    06/23/14 MRI brain and cervical [I reviewed images myself and agree with interpretation. -VRP]  - Cerebellar tonsillar extension through the foramen magnum of 7-8 mm consistent with Chiari malformation. No evidence of cervical cord syrinx. No change since the study of February 2014.  - Otherwise normal appearance of the  brain. - Otherwise normal appearance of the cervical spine.   ASSESSMENT AND PLAN  28 y.o. year old female here with new onset, gradual progressive left face and left arm numbness.  We will proceed with further work-up to rule out demyelinating disease or other autoimmune inflammatory conditions.  Localization: right parietal lobe vs right internal capsule vs right thalamus  Dx:  1. Numbness     Virtual Visit via Video Note  I connected with Eather Chaires Brafford on 12/04/18 at  3:00 PM EDT by a video enabled telemedicine application and verified that I am speaking with the correct person using two identifiers.  Location: Patient: home Provider: office    I discussed the limitations of evaluation and management by telemedicine and the availability of in person appointments. The patient expressed understanding and agreed to proceed.  I discussed the assessment and treatment plan with the patient. The patient was provided an opportunity to ask questions and all were answered. The patient agreed with the plan and demonstrated an understanding of the instructions.   The patient was advised to call back or seek an in-person evaluation if the symptoms worsen or if the condition fails to improve as anticipated.  I provided 30 minutes of non-face-to-face time during this encounter.   PLAN:  - MRI brain (with and without) --> rule out demyelinating disease, vascular or other etiologies  Orders Placed This Encounter  Procedures   MR BRAIN W WO CONTRAST   Return pending test results, for pending if symptoms worsen or fail to improve.    Suanne Marker, MD 12/04/2018, 2:37 PM Certified in Neurology, Neurophysiology and Neuroimaging  [facility] [address] [place], [place] [address] [phone]

## 2018-12-08 ENCOUNTER — Telehealth: Payer: Self-pay | Admitting: Diagnostic Neuroimaging

## 2018-12-08 NOTE — Telephone Encounter (Signed)
[employer] Auth: [id number] (exp. 12/08/18 to 03/07/19) patient is scheduled at GI for 12/15/18.

## 2018-12-11 DIAGNOSIS — F340 Cyclothymic disorder: Secondary | ICD-10-CM | POA: Diagnosis not present

## 2018-12-15 ENCOUNTER — Ambulatory Visit
Admission: RE | Admit: 2018-12-15 | Discharge: 2018-12-15 | Disposition: A | Discharge status: Home / Self Care | Payer: BLUE CROSS/BLUE SHIELD | Source: Ambulatory Visit | Attending: Diagnostic Neuroimaging | Admitting: Diagnostic Neuroimaging

## 2018-12-15 ENCOUNTER — Other Ambulatory Visit: Payer: Self-pay

## 2018-12-15 DIAGNOSIS — R200 Anesthesia of skin: Secondary | ICD-10-CM | POA: Diagnosis not present

## 2018-12-15 DIAGNOSIS — R51 Headache: Secondary | ICD-10-CM | POA: Diagnosis not present

## 2018-12-15 MED ORDER — GADOBENATE DIMEGLUMINE 529 MG/ML IV SOLN
20.0000 mL | Freq: Once | INTRAVENOUS | Status: AC | PRN
  Administered 2018-12-15: 20 mL via INTRAVENOUS

## 2018-12-16 DIAGNOSIS — E785 Hyperlipidemia, unspecified: Secondary | ICD-10-CM | POA: Diagnosis not present

## 2018-12-16 DIAGNOSIS — E282 Polycystic ovarian syndrome: Secondary | ICD-10-CM | POA: Diagnosis not present

## 2018-12-16 DIAGNOSIS — Z6841 Body Mass Index (BMI) 40.0 and over, adult: Secondary | ICD-10-CM | POA: Diagnosis not present

## 2018-12-18 ENCOUNTER — Telehealth: Payer: Self-pay | Admitting: *Deleted

## 2018-12-18 NOTE — Telephone Encounter (Signed)
Patient sent my chart message asking for MRI brain result. I replied to her and advised it typically takes 3-4 days, and I will let Dr Marjory Lies know she is asking for result.

## 2018-12-18 NOTE — Telephone Encounter (Signed)
Called patient and informed her that her MRI brain is unremarkable, no significant change from 06/2014 MRI. Patient verbalized understanding, appreciation.

## 2018-12-18 NOTE — Telephone Encounter (Signed)
Unremarkable MRI. See result note. -VRP

## 2018-12-26 DIAGNOSIS — R200 Anesthesia of skin: Secondary | ICD-10-CM | POA: Diagnosis not present

## 2019-01-08 DIAGNOSIS — F340 Cyclothymic disorder: Secondary | ICD-10-CM | POA: Diagnosis not present

## 2019-01-13 DIAGNOSIS — L814 Other melanin hyperpigmentation: Secondary | ICD-10-CM | POA: Diagnosis not present

## 2019-01-13 DIAGNOSIS — D1801 Hemangioma of skin and subcutaneous tissue: Secondary | ICD-10-CM | POA: Diagnosis not present

## 2019-01-13 DIAGNOSIS — L821 Other seborrheic keratosis: Secondary | ICD-10-CM | POA: Diagnosis not present

## 2019-01-13 DIAGNOSIS — D229 Melanocytic nevi, unspecified: Secondary | ICD-10-CM | POA: Diagnosis not present

## 2019-01-15 DIAGNOSIS — Z713 Dietary counseling and surveillance: Secondary | ICD-10-CM | POA: Diagnosis not present

## 2019-01-22 DIAGNOSIS — F340 Cyclothymic disorder: Secondary | ICD-10-CM | POA: Diagnosis not present

## 2019-02-09 DIAGNOSIS — M542 Cervicalgia: Secondary | ICD-10-CM | POA: Diagnosis not present

## 2019-02-09 DIAGNOSIS — M5412 Radiculopathy, cervical region: Secondary | ICD-10-CM | POA: Diagnosis not present

## 2019-02-09 DIAGNOSIS — M9901 Segmental and somatic dysfunction of cervical region: Secondary | ICD-10-CM | POA: Diagnosis not present

## 2019-02-09 DIAGNOSIS — M9907 Segmental and somatic dysfunction of upper extremity: Secondary | ICD-10-CM | POA: Diagnosis not present

## 2019-02-11 DIAGNOSIS — M9901 Segmental and somatic dysfunction of cervical region: Secondary | ICD-10-CM | POA: Diagnosis not present

## 2019-02-11 DIAGNOSIS — M542 Cervicalgia: Secondary | ICD-10-CM | POA: Diagnosis not present

## 2019-02-11 DIAGNOSIS — M9907 Segmental and somatic dysfunction of upper extremity: Secondary | ICD-10-CM | POA: Diagnosis not present

## 2019-02-11 DIAGNOSIS — M5412 Radiculopathy, cervical region: Secondary | ICD-10-CM | POA: Diagnosis not present

## 2019-02-12 DIAGNOSIS — F340 Cyclothymic disorder: Secondary | ICD-10-CM | POA: Diagnosis not present

## 2019-02-12 DIAGNOSIS — M9901 Segmental and somatic dysfunction of cervical region: Secondary | ICD-10-CM | POA: Diagnosis not present

## 2019-02-12 DIAGNOSIS — M542 Cervicalgia: Secondary | ICD-10-CM | POA: Diagnosis not present

## 2019-02-12 DIAGNOSIS — M5412 Radiculopathy, cervical region: Secondary | ICD-10-CM | POA: Diagnosis not present

## 2019-02-12 DIAGNOSIS — M9907 Segmental and somatic dysfunction of upper extremity: Secondary | ICD-10-CM | POA: Diagnosis not present

## 2019-02-13 DIAGNOSIS — Z713 Dietary counseling and surveillance: Secondary | ICD-10-CM | POA: Diagnosis not present

## 2019-02-24 DIAGNOSIS — M542 Cervicalgia: Secondary | ICD-10-CM | POA: Diagnosis not present

## 2019-02-24 DIAGNOSIS — M5412 Radiculopathy, cervical region: Secondary | ICD-10-CM | POA: Diagnosis not present

## 2019-02-24 DIAGNOSIS — M9901 Segmental and somatic dysfunction of cervical region: Secondary | ICD-10-CM | POA: Diagnosis not present

## 2019-02-24 DIAGNOSIS — M9907 Segmental and somatic dysfunction of upper extremity: Secondary | ICD-10-CM | POA: Diagnosis not present

## 2019-02-26 DIAGNOSIS — K219 Gastro-esophageal reflux disease without esophagitis: Secondary | ICD-10-CM | POA: Diagnosis not present

## 2019-02-26 DIAGNOSIS — E282 Polycystic ovarian syndrome: Secondary | ICD-10-CM | POA: Diagnosis not present

## 2019-02-26 DIAGNOSIS — E785 Hyperlipidemia, unspecified: Secondary | ICD-10-CM | POA: Diagnosis not present

## 2019-02-26 DIAGNOSIS — M542 Cervicalgia: Secondary | ICD-10-CM | POA: Diagnosis not present

## 2019-02-26 DIAGNOSIS — M5412 Radiculopathy, cervical region: Secondary | ICD-10-CM | POA: Diagnosis not present

## 2019-02-26 DIAGNOSIS — M9907 Segmental and somatic dysfunction of upper extremity: Secondary | ICD-10-CM | POA: Diagnosis not present

## 2019-02-26 DIAGNOSIS — M9901 Segmental and somatic dysfunction of cervical region: Secondary | ICD-10-CM | POA: Diagnosis not present

## 2019-03-05 DIAGNOSIS — M9907 Segmental and somatic dysfunction of upper extremity: Secondary | ICD-10-CM | POA: Diagnosis not present

## 2019-03-05 DIAGNOSIS — M542 Cervicalgia: Secondary | ICD-10-CM | POA: Diagnosis not present

## 2019-03-05 DIAGNOSIS — M9901 Segmental and somatic dysfunction of cervical region: Secondary | ICD-10-CM | POA: Diagnosis not present

## 2019-03-05 DIAGNOSIS — M5412 Radiculopathy, cervical region: Secondary | ICD-10-CM | POA: Diagnosis not present

## 2019-03-09 ENCOUNTER — Other Ambulatory Visit: Payer: Self-pay

## 2019-03-09 ENCOUNTER — Emergency Department (HOSPITAL_BASED_OUTPATIENT_CLINIC_OR_DEPARTMENT_OTHER): Payer: BC Managed Care – PPO

## 2019-03-09 ENCOUNTER — Emergency Department (HOSPITAL_BASED_OUTPATIENT_CLINIC_OR_DEPARTMENT_OTHER)
Admission: EM | Admit: 2019-03-09 | Discharge: 2019-03-09 | Disposition: A | Discharge status: Home / Self Care | Payer: BC Managed Care – PPO | Attending: Emergency Medicine | Admitting: Emergency Medicine

## 2019-03-09 ENCOUNTER — Encounter (HOSPITAL_BASED_OUTPATIENT_CLINIC_OR_DEPARTMENT_OTHER): Payer: Self-pay

## 2019-03-09 DIAGNOSIS — Z79899 Other long term (current) drug therapy: Secondary | ICD-10-CM | POA: Insufficient documentation

## 2019-03-09 DIAGNOSIS — Z3202 Encounter for pregnancy test, result negative: Secondary | ICD-10-CM | POA: Diagnosis not present

## 2019-03-09 DIAGNOSIS — R1011 Right upper quadrant pain: Secondary | ICD-10-CM | POA: Insufficient documentation

## 2019-03-09 DIAGNOSIS — R0789 Other chest pain: Secondary | ICD-10-CM | POA: Insufficient documentation

## 2019-03-09 DIAGNOSIS — Z7984 Long term (current) use of oral hypoglycemic drugs: Secondary | ICD-10-CM | POA: Diagnosis not present

## 2019-03-09 DIAGNOSIS — R160 Hepatomegaly, not elsewhere classified: Secondary | ICD-10-CM | POA: Diagnosis not present

## 2019-03-09 DIAGNOSIS — Z87891 Personal history of nicotine dependence: Secondary | ICD-10-CM | POA: Diagnosis not present

## 2019-03-09 DIAGNOSIS — K7689 Other specified diseases of liver: Secondary | ICD-10-CM | POA: Diagnosis not present

## 2019-03-09 DIAGNOSIS — K769 Liver disease, unspecified: Secondary | ICD-10-CM | POA: Diagnosis not present

## 2019-03-09 DIAGNOSIS — R079 Chest pain, unspecified: Secondary | ICD-10-CM | POA: Diagnosis not present

## 2019-03-09 DIAGNOSIS — R000 Tachycardia, unspecified: Secondary | ICD-10-CM | POA: Diagnosis not present

## 2019-03-09 DIAGNOSIS — K760 Fatty (change of) liver, not elsewhere classified: Secondary | ICD-10-CM | POA: Diagnosis not present

## 2019-03-09 HISTORY — DX: Panic disorder (episodic paroxysmal anxiety): F41.0

## 2019-03-09 LAB — CBC WITH DIFFERENTIAL/PLATELET
Abs Immature Granulocytes: 0.02 10*3/uL (ref 0.00–0.07)
Basophils Absolute: 0 10*3/uL (ref 0.0–0.1)
Basophils Relative: 0 %
Eosinophils Absolute: 0 10*3/uL (ref 0.0–0.5)
Eosinophils Relative: 0 %
HCT: 42.8 % (ref 36.0–46.0)
Hemoglobin: 14 g/dL (ref 12.0–15.0)
Immature Granulocytes: 0 %
Lymphocytes Relative: 18 %
Lymphs Abs: 1.4 10*3/uL (ref 0.7–4.0)
MCH: 28.3 pg (ref 26.0–34.0)
MCHC: 32.7 g/dL (ref 30.0–36.0)
MCV: 86.6 fL (ref 80.0–100.0)
Monocytes Absolute: 0.5 10*3/uL (ref 0.1–1.0)
Monocytes Relative: 6 %
Neutro Abs: 6.1 10*3/uL (ref 1.7–7.7)
Neutrophils Relative %: 76 %
Platelets: 292 10*3/uL (ref 150–400)
RBC: 4.94 MIL/uL (ref 3.87–5.11)
RDW: 12.5 % (ref 11.5–15.5)
WBC: 8 10*3/uL (ref 4.0–10.5)
nRBC: 0 % (ref 0.0–0.2)

## 2019-03-09 LAB — COMPREHENSIVE METABOLIC PANEL
ALT: 20 U/L (ref 0–44)
AST: 15 U/L (ref 15–41)
Albumin: 4.5 g/dL (ref 3.5–5.0)
Alkaline Phosphatase: 54 U/L (ref 38–126)
Anion gap: 9 (ref 5–15)
BUN: 12 mg/dL (ref 6–20)
CO2: 23 mmol/L (ref 22–32)
Calcium: 9.5 mg/dL (ref 8.9–10.3)
Chloride: 107 mmol/L (ref 98–111)
Creatinine, Ser: 0.44 mg/dL (ref 0.44–1.00)
GFR calc Af Amer: >60 mL/min (ref >60)
GFR calc non Af Amer: >60 mL/min (ref >60)
Glucose, Bld: 99 mg/dL (ref 70–99)
Potassium: 4.1 mmol/L (ref 3.5–5.1)
Sodium: 139 mmol/L (ref 135–145)
Total Bilirubin: 1.2 mg/dL (ref 0.3–1.2)
Total Protein: 7.3 g/dL (ref 6.5–8.1)

## 2019-03-09 LAB — LIPASE, BLOOD: Lipase: 34 U/L (ref 11–51)

## 2019-03-09 LAB — PREGNANCY, URINE: Preg Test, Ur: NEGATIVE

## 2019-03-09 LAB — D-DIMER, QUANTITATIVE: D-Dimer, Quant: <0.27 ug/mL-FEU (ref 0.00–0.50)

## 2019-03-09 MED ORDER — PANTOPRAZOLE SODIUM 20 MG PO TBEC: 20.0000 mg | DELAYED_RELEASE_TABLET | Freq: Every day | ORAL | 0 refills | Status: DC

## 2019-03-09 MED ORDER — SUCRALFATE 1 G PO TABS
1.0000 g | ORAL_TABLET | Freq: Once | ORAL | Status: DC
  Filled 2019-03-09: qty 1

## 2019-03-09 MED ORDER — SUCRALFATE 1 G PO TABS: 1.0000 g | ORAL_TABLET | Freq: Three times a day (TID) | ORAL | 0 refills | Status: AC

## 2019-03-09 MED ORDER — FAMOTIDINE 20 MG PO TABS
20.0000 mg | ORAL_TABLET | Freq: Once | ORAL | Status: AC
  Administered 2019-03-09: 20 mg via ORAL
  Filled 2019-03-09: qty 1

## 2019-03-09 MED ORDER — SODIUM CHLORIDE 0.9 % IV BOLUS
1000.0000 mL | Freq: Once | INTRAVENOUS | Status: AC
  Administered 2019-03-09: 14:00:00 1000 mL via INTRAVENOUS

## 2019-03-09 MED ORDER — ALUM & MAG HYDROXIDE-SIMETH 200-200-20 MG/5ML PO SUSP
30.0000 mL | Freq: Once | ORAL | Status: AC
  Administered 2019-03-09: 14:00:00 30 mL via ORAL
  Filled 2019-03-09: qty 30

## 2019-03-09 MED FILL — PANTOPRAZOLE SOD DR 20 MG T: 20 | 7 days supply | Qty: 7 | Fill #0

## 2019-03-09 MED FILL — SUCRALFATE 1 GM TABLET: 1 | 7 days supply | Qty: 28 | Fill #0

## 2019-03-09 NOTE — ED Provider Notes (Signed)
[facility] EMERGENCY DEPARTMENT Provider Note   CSN: [id number] Arrival date & time: 03/09/19  1226    History   Chief Complaint Chief Complaint  Patient presents with   Chest Pain    HPI Cheryl [name] is a 28 y.o. female.     HPI   Pt is a 28 y/o female who presents to the ED today for eval of chest tightness and burning that has been constant for the last 3 days.  Pain is located on the left side of her chest. It does not radiate to her arm, jaw, and back. Denies pain, just states it is uncomfortable. Pain is not worse with exertion or certain movements. It has been constant for 3 days. States it feels like she has icy hot on her chest.   She initially thought it might be GERD and she has tried taking pepcid, tums, maalox, and a lavender pill and none of these have improved her sxs. She also states that she has a hx of panic attacks, but she thinks this feels different. She denies feeling any increased stress or anxiety. She helped her friend move into a new house last week.   Denies SOB, cough, or pain with inspiration. Denies any known COVID exposures or sick contacts. Denies fevers, sweats, or chills. Denies abd pain, nausea, vomiting, diarrhea, constipation, urinary sxs. Denies sore throat, congestion.   Denies leg pain/swelling, hemoptysis, recent surgery/trauma, recent long travel, hormone use, personal hx of cancer, or hx of DVT/PE.   Denies h/o HTN, HLD, DM, or tobacco use. Denies ETOH or drug use.   Past Medical History:  Diagnosis Date   Anxiety    Chiari malformation type I (HCC)    Gallbladder attack    GERD (gastroesophageal reflux disease)    Hyperlipidemia    Insomnia    Liver cyst    Medical history non-contributory    Panic attack    PCOS (polycystic ovarian syndrome)     Patient Active Problem List   Diagnosis Date Noted   Indication for care in labor or delivery 06/13/2017   Vaginal delivery 01/31/2014   Small for  gestational age fetus affecting mother, antepartum 01/29/2014    Past Surgical History:  Procedure Laterality Date   TONSILLECTOMY  2015     OB History    Gravida  2   Para  2   Term  2   Preterm  0   AB  0   Living  2     SAB  0   TAB  0   Ectopic  0   Multiple  0   Live Births  2            Home Medications    Prior to Admission medications   Medication Sig Start Date End Date Taking? Authorizing Provider  Omega-3 Fatty Acids (FISH OIL PO) Take by mouth.   Yes [provider]  acetaminophen (TYLENOL) 325 MG tablet Take 650 mg by mouth every 6 (six) hours as needed for headache.     [provider]  calcium carbonate (TUMS - DOSED IN MG ELEMENTAL CALCIUM) 500 MG chewable tablet Chew 2-4 tablets by mouth daily as needed for indigestion or heartburn.     [provider]  Cholecalciferol (D3-50) 1.25 MG (50000 UT) capsule Take by mouth. 07/18/18   [provider]  famotidine (PEPCID) 20 MG tablet Take 20 mg by mouth at bedtime.    [provider]  hydrOXYzine (  VISTARIL) 50 MG capsule TAKE 1 CAPSULE BY MOUTH EVERY 6 HOURS AS NEEDED 11/03/18   [provider]  ibuprofen (ADVIL,MOTRIN) 600 MG tablet Take 1 tablet (600 mg total) every 6 (six) hours by mouth. 06/15/17   Clemmons, Lori A, CNM  LATUDA 20 MG TABS tablet TAKE 1 2 (ONE HALF) TABLET BY MOUTH ONCE DAILY FOR 4 DAYS AND 1 TABLETS ONCE DAILY FOR 4 DAYS AND 2 TABLETS ONCE DAILY TAKE WITH 350 CAL. ME 11/03/18   [provider]  LATUDA 40 MG TABS tablet 12/03/18 not started yet 11/03/18   [provider]  Melatonin (MELATONIN MAXIMUM STRENGTH) 5 MG TABS Take by mouth as needed.    [provider]  metFORMIN (GLUCOPHAGE) 500 MG tablet Take 500 mg by mouth daily. 02/03/18   [provider]  pantoprazole (PROTONIX) 40 MG tablet Take 40 mg daily by mouth.    [provider]  Prenatal MV-Min-FA-Omega-3 (PRENATAL GUMMIES/DHA & FA  PO) Take 2 tablets by mouth daily.    [provider]  zaleplon (SONATA) 10 MG capsule TAKE 1 CAPSULE BY MOUTH EVERY DAY AT BEDTIME AS NEEDED DO NOT TAKE WITHIN 30 MINUTES OF EATING 01/08/19   [provider]    Family History Family History  Problem Relation Age of Onset   Diabetes Maternal Grandmother    Cancer Maternal Grandmother    Hyperlipidemia Maternal Grandmother    Hyperlipidemia Paternal Grandmother    Diabetes Paternal Grandmother    Parkinson's disease Paternal Grandmother    Diabetes Paternal Grandfather    Hyperlipidemia Paternal Grandfather    Hypertension Mother    Depression Mother    Parkinson's disease Mother    Thyroid disease Mother    Diabetes Father    Hypertension Father    Birth defects Sister        heart defect brought on by labor    Social History Social History   Tobacco Use   Smoking status: Former Smoker    Types: Cigarettes    Quit date: 12/03/2010    Years since quitting: 8.2   Smokeless tobacco: Never Used  Substance Use Topics   Alcohol use: Not Currently   Drug use: No     Allergies   Amoxicillin, Effexor [venlafaxine], Erythromycin, Oxycontin [oxycodone hcl], Vicodin [hydrocodone-acetaminophen], Bupropion, and Mold extract [trichophyton]   Review of Systems Review of Systems  Constitutional: Negative for chills and fever.  HENT: Negative for ear pain and sore throat.   Eyes: Negative for visual disturbance.  Respiratory: Negative for cough and shortness of breath.   Cardiovascular: Positive for chest pain (tightness/burning). Negative for palpitations and leg swelling.  Gastrointestinal: Negative for abdominal pain, constipation, diarrhea, nausea and vomiting.  Genitourinary: Negative for dysuria and hematuria.  Musculoskeletal: Negative for back pain.  Skin: Negative for rash.  Neurological: Negative for headaches.  All other systems reviewed and are negative.    Physical Exam Updated  Vital Signs BP 113/83 (BP Location: Left Arm)    Pulse 89    Temp 98.6 F (37 C) (Oral)    Resp 18    Ht 5\' 4"  (1.626 m)    Wt 103.4 kg    LMP 02/25/2019    SpO2 100%    BMI 39.14 kg/m   Physical Exam Vitals signs and nursing note reviewed.  Constitutional:      General: She is not in acute distress.    Appearance: She is well-developed.  HENT:     Head: Normocephalic and  atraumatic.  Eyes:     Conjunctiva/sclera: Conjunctivae normal.  Neck:     Musculoskeletal: Neck supple.  Cardiovascular:     Rate and Rhythm: Normal rate and regular rhythm.     Heart sounds: No murmur.  Pulmonary:     Effort: Pulmonary effort is normal. No respiratory distress.     Breath sounds: Normal breath sounds. No decreased breath sounds, wheezing, rhonchi or rales.  Chest:     Chest wall: No tenderness.  Abdominal:     General: Bowel sounds are normal.     Palpations: Abdomen is soft.     Tenderness: There is abdominal tenderness in the right upper quadrant, epigastric area and left upper quadrant. There is no right CVA tenderness, left CVA tenderness, guarding or rebound.  Skin:    General: Skin is warm and dry.  Neurological:     Mental Status: She is alert.     ED Treatments / Results  Labs (all labs ordered are listed, but only abnormal results are displayed) Labs Reviewed  CBC WITH DIFFERENTIAL/PLATELET  COMPREHENSIVE METABOLIC PANEL  LIPASE, BLOOD  D-DIMER, QUANTITATIVE (NOT AT [facility])  PREGNANCY, URINE    EKG EKG Interpretation  Date/Time:  Monday March 09 2019 12:33:22 EDT Ventricular Rate:  139 PR Interval:  130 QRS Duration: 82 QT Interval:  288 QTC Calculation: 438 R Axis:   94 Text Interpretation:  Sinus tachycardia Non-specific ST-t changes Confirmed by Cathren LaineSteinl, Kevin ([id number]) on 03/09/2019 12:38:24 PM Also confirmed by Cathren LaineSteinl, Kevin ([id number]), editor Barbette Hairassel, Kerry [phone])  on 03/09/2019 1:26:07 PM   Radiology Dg Chest Portable 1 View  Result Date: 03/09/2019 CLINICAL DATA:   Chest pain for 3 days. EXAM: PORTABLE CHEST 1 VIEW COMPARISON:  Chest x-ray 05/30/2016 FINDINGS: The heart size and mediastinal contours are within normal limits. Both lungs are clear. The visualized skeletal structures are unremarkable. IMPRESSION: Normal chest x-ray. Electronically Signed   By: Rudie MeyerP.  Gallerani M.D.   On: 03/09/2019 13:54   [place] Abdomen Limited Ruq  Result Date: 03/09/2019 CLINICAL DATA:  Right upper quadrant EXAM: ULTRASOUND ABDOMEN LIMITED RIGHT UPPER QUADRANT COMPARISON:  None. FINDINGS: Gallbladder: No gallstones or wall thickening visualized. There is no pericholecystic fluid. No sonographic Murphy sign noted by sonographer. Common bile 8 mm, prominent. Portions of the distal common bile duct are obscured by gas. No intrahepatic biliary duct dilatation. Liver: A previously noted solid mass in the left lobe of the liver is again noted, currently measuring 2.0 x 2.0 x 2.8 cm, perhaps marginally smaller than on previous study. No other focal liver lesion is evident. Liver echogenicity overall is increased. Portal vein is patent on color Doppler imaging with normal direction of blood flow towards the liver. Other: None. IMPRESSION: 1. Solid mass in the left lobe of the liver, noted previously. This mass may be minimally smaller than on the previous study. Its etiology is uncertain. From an imaging standpoint, MR pre and serial post-contrast would be the imaging study of choice for further assessment of this lesion. 2. Increased liver echogenicity, a finding indicative of hepatic steatosis. No new liver lesions are evident. Note that the sensitivity of ultrasound for detection of liver lesions is somewhat diminished in this circumstance. 3. Slight prominence of the proximal common hepatic duct. More distally, the common bile duct is obscured by gas. From an imaging standpoint, MRCP would be the imaging study of choice for further assessment of the biliary ductal system. 4.  No evident gallbladder  pathology. Electronically Signed  By: Bretta BangWilliam  Woodruff III M.D.   On: 03/09/2019 15:28    Procedures Procedures (including critical care time)  Medications Ordered in ED Medications  sucralfate (CARAFATE) tablet 1 g (1 g Oral Not Given 03/09/19 1428)  alum & mag hydroxide-simeth (MAALOX/MYLANTA) 200-200-20 MG/5ML suspension 30 mL (30 mLs Oral Given 03/09/19 1338)  famotidine (PEPCID) tablet 20 mg (20 mg Oral Given 03/09/19 1338)  sodium chloride 0.9 % bolus 1,000 mL ( Intravenous Stopped 03/09/19 1454)     Initial Impression / Assessment and Plan / ED Course  I have reviewed the triage vital signs and the nursing notes.  Pertinent labs & imaging results that were available during my care of the patient were reviewed by me and considered in my medical decision making (see chart for details).   Final Clinical Impressions(s) / ED Diagnoses   Final diagnoses:  RUQ pain   Pt c/o chest tightness/burning x3 days. No SOB, cough, pleuritic pain. No risk factors for PE. Low risk for ACS.   VS with tachycardia. Normal sats. BP normal. On exam, heart lungs, WNL other than tachycardia. Has some epigastric, RUQ and LUQ TTP.   Will check abd labs, ddimer, EKG, CXR. Will give meds and reassess.  CBC without leukocytosis, no anemia. CMP with normal electrolytes, normal renal and hepatic function.  Normal bilirubin. Lipase normal Ddimer negative upreg negative  EKG with sinus tachycardia and nonspecific ST-T wave changes.   CXR negative  RUQ US: 1. Solid mass in the left lobe of the liver, noted previously. This mass may be minimally smaller than on the previous study. Its etiology is uncertain. From an imaging standpoint, MR pre and serial post-contrast would be the imaging study of choice for further assessment of this lesion. 2. Increased liver echogenicity, a finding indicative of hepatic steatosis. No new liver lesions are evident. Note that the sensitivity of ultrasound for detection of liver  lesions is somewhat diminished in this circumstance. 3. Slight prominence of the proximal common hepatic duct. More distally, the common bile duct is obscured by gas. From an imaging standpoint, MRCP would be the imaging study of choice for further assessment of the biliary ductal system. 4.  No evident gallbladder pathology.   Reassessed patient.  She states she is feeling much better after medications.  Discussed the findings of the ultrasound that she may need MRCP but that I think she can get this study as an outpatient.  States she follows with [facility] and she will contact them for follow-up.  Also discussed the abnormal findings in her liver which she states she is aware of.  She recently had MRI of the abdomen and is planning on following up with GI for results of this.  Advised that if her symptoms worsen or she experiences any persistent abdominal pain nausea vomiting that she should return to the emergency department immediately.  Also advised to return for persistent chest pain/shortness of breath.  She voiced understanding the plan and reasons return.  All questions answered.  Patient stable discharge.  ED Discharge Orders    None       Rayne DuCouture, Ayahna Solazzo S, PA-C 03/09/19 2235    Cathren LaineSteinl, Kevin, MD 03/10/19 [id number]

## 2019-03-09 NOTE — ED Notes (Signed)
Patient transported to Ultrasound 

## 2019-03-09 NOTE — Discharge Instructions (Signed)
You were given medications to help with your symptoms.  Please take as directed.  You will need to follow-up with your gastroenterologist in regards to the abnormal findings on your ultrasound today.  You may need an outpatient MRCP.  Please return to the ER sooner if you have any new or worsening symptoms, or if you have any of the following symptoms: Chest pain, shortness of breath, abdominal pain, persistent vomiting, fevers.

## 2019-03-09 NOTE — ED Triage Notes (Addendum)
pt c/o chest tightness/burning x 3 days-NAD-steady gait-pt appears anxious-states she has hx of panic attacks-denies fever, cough/flu like sx

## 2019-03-09 NOTE — ED Notes (Signed)
ED Provider at bedside. 

## 2019-03-10 DIAGNOSIS — F3181 Bipolar II disorder: Secondary | ICD-10-CM | POA: Diagnosis not present

## 2019-03-18 DIAGNOSIS — R1310 Dysphagia, unspecified: Secondary | ICD-10-CM | POA: Diagnosis not present

## 2019-03-18 DIAGNOSIS — D649 Anemia, unspecified: Secondary | ICD-10-CM | POA: Diagnosis not present

## 2019-03-18 DIAGNOSIS — R9389 Abnormal findings on diagnostic imaging of other specified body structures: Secondary | ICD-10-CM | POA: Diagnosis not present

## 2019-03-18 DIAGNOSIS — Z136 Encounter for screening for cardiovascular disorders: Secondary | ICD-10-CM | POA: Diagnosis not present

## 2019-03-18 DIAGNOSIS — E785 Hyperlipidemia, unspecified: Secondary | ICD-10-CM | POA: Diagnosis not present

## 2019-03-18 DIAGNOSIS — K219 Gastro-esophageal reflux disease without esophagitis: Secondary | ICD-10-CM | POA: Diagnosis not present

## 2019-03-18 DIAGNOSIS — E559 Vitamin D deficiency, unspecified: Secondary | ICD-10-CM | POA: Diagnosis not present

## 2019-03-18 DIAGNOSIS — Z6841 Body Mass Index (BMI) 40.0 and over, adult: Secondary | ICD-10-CM | POA: Diagnosis not present

## 2019-03-18 DIAGNOSIS — Z87891 Personal history of nicotine dependence: Secondary | ICD-10-CM | POA: Diagnosis not present

## 2019-03-18 DIAGNOSIS — K769 Liver disease, unspecified: Secondary | ICD-10-CM | POA: Diagnosis not present

## 2019-03-18 DIAGNOSIS — Z131 Encounter for screening for diabetes mellitus: Secondary | ICD-10-CM | POA: Diagnosis not present

## 2019-03-18 DIAGNOSIS — E282 Polycystic ovarian syndrome: Secondary | ICD-10-CM | POA: Diagnosis not present

## 2019-03-19 DIAGNOSIS — Z87891 Personal history of nicotine dependence: Secondary | ICD-10-CM | POA: Diagnosis not present

## 2019-03-19 DIAGNOSIS — F17210 Nicotine dependence, cigarettes, uncomplicated: Secondary | ICD-10-CM | POA: Diagnosis not present

## 2019-03-19 DIAGNOSIS — Z01818 Encounter for other preprocedural examination: Secondary | ICD-10-CM | POA: Diagnosis not present

## 2019-03-19 DIAGNOSIS — Z0000 Encounter for general adult medical examination without abnormal findings: Secondary | ICD-10-CM | POA: Diagnosis not present

## 2019-03-19 DIAGNOSIS — E559 Vitamin D deficiency, unspecified: Secondary | ICD-10-CM | POA: Diagnosis not present

## 2019-03-20 ENCOUNTER — Other Ambulatory Visit: Payer: Self-pay | Admitting: Gastroenterology

## 2019-03-20 DIAGNOSIS — Z1159 Encounter for screening for other viral diseases: Secondary | ICD-10-CM | POA: Diagnosis not present

## 2019-03-20 DIAGNOSIS — K769 Liver disease, unspecified: Secondary | ICD-10-CM

## 2019-03-25 DIAGNOSIS — K219 Gastro-esophageal reflux disease without esophagitis: Secondary | ICD-10-CM | POA: Diagnosis not present

## 2019-03-25 DIAGNOSIS — K2970 Gastritis, unspecified, without bleeding: Secondary | ICD-10-CM | POA: Diagnosis not present

## 2019-03-25 DIAGNOSIS — K210 Gastro-esophageal reflux disease with esophagitis: Secondary | ICD-10-CM | POA: Diagnosis not present

## 2019-03-25 DIAGNOSIS — K2930 Chronic superficial gastritis without bleeding: Secondary | ICD-10-CM | POA: Diagnosis not present

## 2019-03-25 DIAGNOSIS — R1310 Dysphagia, unspecified: Secondary | ICD-10-CM | POA: Diagnosis not present

## 2019-03-26 DIAGNOSIS — M9901 Segmental and somatic dysfunction of cervical region: Secondary | ICD-10-CM | POA: Diagnosis not present

## 2019-03-26 DIAGNOSIS — M5412 Radiculopathy, cervical region: Secondary | ICD-10-CM | POA: Diagnosis not present

## 2019-03-26 DIAGNOSIS — M542 Cervicalgia: Secondary | ICD-10-CM | POA: Diagnosis not present

## 2019-03-26 DIAGNOSIS — M9907 Segmental and somatic dysfunction of upper extremity: Secondary | ICD-10-CM | POA: Diagnosis not present

## 2019-04-16 DIAGNOSIS — M9901 Segmental and somatic dysfunction of cervical region: Secondary | ICD-10-CM | POA: Diagnosis not present

## 2019-04-16 DIAGNOSIS — M9907 Segmental and somatic dysfunction of upper extremity: Secondary | ICD-10-CM | POA: Diagnosis not present

## 2019-04-16 DIAGNOSIS — M542 Cervicalgia: Secondary | ICD-10-CM | POA: Diagnosis not present

## 2019-04-16 DIAGNOSIS — M5412 Radiculopathy, cervical region: Secondary | ICD-10-CM | POA: Diagnosis not present

## 2019-04-17 ENCOUNTER — Other Ambulatory Visit: Payer: BC Managed Care – PPO

## 2019-04-21 DIAGNOSIS — K219 Gastro-esophageal reflux disease without esophagitis: Secondary | ICD-10-CM | POA: Diagnosis not present

## 2019-04-21 DIAGNOSIS — E282 Polycystic ovarian syndrome: Secondary | ICD-10-CM | POA: Diagnosis not present

## 2019-04-28 ENCOUNTER — Telehealth: Payer: Self-pay | Admitting: *Deleted

## 2019-04-28 ENCOUNTER — Ambulatory Visit: Payer: BLUE CROSS/BLUE SHIELD | Admitting: Diagnostic Neuroimaging

## 2019-04-28 NOTE — Telephone Encounter (Signed)
Patient was no show for follow up today. 

## 2019-04-29 ENCOUNTER — Encounter: Payer: Self-pay | Admitting: Diagnostic Neuroimaging

## 2019-04-30 DIAGNOSIS — M9901 Segmental and somatic dysfunction of cervical region: Secondary | ICD-10-CM | POA: Diagnosis not present

## 2019-04-30 DIAGNOSIS — M9907 Segmental and somatic dysfunction of upper extremity: Secondary | ICD-10-CM | POA: Diagnosis not present

## 2019-04-30 DIAGNOSIS — M5412 Radiculopathy, cervical region: Secondary | ICD-10-CM | POA: Diagnosis not present

## 2019-04-30 DIAGNOSIS — M542 Cervicalgia: Secondary | ICD-10-CM | POA: Diagnosis not present

## 2019-05-06 DIAGNOSIS — R200 Anesthesia of skin: Secondary | ICD-10-CM | POA: Diagnosis not present

## 2019-05-12 DIAGNOSIS — M9901 Segmental and somatic dysfunction of cervical region: Secondary | ICD-10-CM | POA: Diagnosis not present

## 2019-05-12 DIAGNOSIS — M5412 Radiculopathy, cervical region: Secondary | ICD-10-CM | POA: Diagnosis not present

## 2019-05-12 DIAGNOSIS — M542 Cervicalgia: Secondary | ICD-10-CM | POA: Diagnosis not present

## 2019-05-12 DIAGNOSIS — M9907 Segmental and somatic dysfunction of upper extremity: Secondary | ICD-10-CM | POA: Diagnosis not present

## 2019-06-03 DIAGNOSIS — Z23 Encounter for immunization: Secondary | ICD-10-CM | POA: Diagnosis not present

## 2019-06-04 DIAGNOSIS — M5412 Radiculopathy, cervical region: Secondary | ICD-10-CM | POA: Diagnosis not present

## 2019-06-04 DIAGNOSIS — M9901 Segmental and somatic dysfunction of cervical region: Secondary | ICD-10-CM | POA: Diagnosis not present

## 2019-06-04 DIAGNOSIS — M542 Cervicalgia: Secondary | ICD-10-CM | POA: Diagnosis not present

## 2019-06-04 DIAGNOSIS — M9907 Segmental and somatic dysfunction of upper extremity: Secondary | ICD-10-CM | POA: Diagnosis not present

## 2019-06-10 DIAGNOSIS — Z20828 Contact with and (suspected) exposure to other viral communicable diseases: Secondary | ICD-10-CM | POA: Diagnosis not present

## 2019-06-25 DIAGNOSIS — M542 Cervicalgia: Secondary | ICD-10-CM | POA: Diagnosis not present

## 2019-06-25 DIAGNOSIS — M9907 Segmental and somatic dysfunction of upper extremity: Secondary | ICD-10-CM | POA: Diagnosis not present

## 2019-06-25 DIAGNOSIS — M5412 Radiculopathy, cervical region: Secondary | ICD-10-CM | POA: Diagnosis not present

## 2019-06-25 DIAGNOSIS — M9901 Segmental and somatic dysfunction of cervical region: Secondary | ICD-10-CM | POA: Diagnosis not present

## 2019-07-16 DIAGNOSIS — M9907 Segmental and somatic dysfunction of upper extremity: Secondary | ICD-10-CM | POA: Diagnosis not present

## 2019-07-16 DIAGNOSIS — M542 Cervicalgia: Secondary | ICD-10-CM | POA: Diagnosis not present

## 2019-07-16 DIAGNOSIS — M9901 Segmental and somatic dysfunction of cervical region: Secondary | ICD-10-CM | POA: Diagnosis not present

## 2019-07-16 DIAGNOSIS — M5412 Radiculopathy, cervical region: Secondary | ICD-10-CM | POA: Diagnosis not present

## 2019-07-29 DIAGNOSIS — R200 Anesthesia of skin: Secondary | ICD-10-CM | POA: Diagnosis not present

## 2019-07-29 DIAGNOSIS — Q0700 Arnold-Chiari syndrome without spina bifida or hydrocephalus: Secondary | ICD-10-CM | POA: Diagnosis not present

## 2019-08-03 DIAGNOSIS — R200 Anesthesia of skin: Secondary | ICD-10-CM | POA: Diagnosis not present

## 2019-08-05 DIAGNOSIS — M9901 Segmental and somatic dysfunction of cervical region: Secondary | ICD-10-CM | POA: Diagnosis not present

## 2019-08-05 DIAGNOSIS — M542 Cervicalgia: Secondary | ICD-10-CM | POA: Diagnosis not present

## 2019-08-05 DIAGNOSIS — M9907 Segmental and somatic dysfunction of upper extremity: Secondary | ICD-10-CM | POA: Diagnosis not present

## 2019-08-05 DIAGNOSIS — M5412 Radiculopathy, cervical region: Secondary | ICD-10-CM | POA: Diagnosis not present

## 2019-08-13 DIAGNOSIS — M542 Cervicalgia: Secondary | ICD-10-CM | POA: Diagnosis not present

## 2019-08-13 DIAGNOSIS — M9901 Segmental and somatic dysfunction of cervical region: Secondary | ICD-10-CM | POA: Diagnosis not present

## 2019-08-13 DIAGNOSIS — M5412 Radiculopathy, cervical region: Secondary | ICD-10-CM | POA: Diagnosis not present

## 2019-08-13 DIAGNOSIS — M9907 Segmental and somatic dysfunction of upper extremity: Secondary | ICD-10-CM | POA: Diagnosis not present

## 2019-08-25 DIAGNOSIS — M9907 Segmental and somatic dysfunction of upper extremity: Secondary | ICD-10-CM | POA: Diagnosis not present

## 2019-08-25 DIAGNOSIS — M542 Cervicalgia: Secondary | ICD-10-CM | POA: Diagnosis not present

## 2019-08-25 DIAGNOSIS — M9901 Segmental and somatic dysfunction of cervical region: Secondary | ICD-10-CM | POA: Diagnosis not present

## 2019-08-25 DIAGNOSIS — M5412 Radiculopathy, cervical region: Secondary | ICD-10-CM | POA: Diagnosis not present

## 2019-09-02 DIAGNOSIS — K219 Gastro-esophageal reflux disease without esophagitis: Secondary | ICD-10-CM | POA: Diagnosis not present

## 2019-09-02 DIAGNOSIS — K769 Liver disease, unspecified: Secondary | ICD-10-CM | POA: Diagnosis not present

## 2019-09-03 DIAGNOSIS — M542 Cervicalgia: Secondary | ICD-10-CM | POA: Diagnosis not present

## 2019-09-03 DIAGNOSIS — M9907 Segmental and somatic dysfunction of upper extremity: Secondary | ICD-10-CM | POA: Diagnosis not present

## 2019-09-03 DIAGNOSIS — M5412 Radiculopathy, cervical region: Secondary | ICD-10-CM | POA: Diagnosis not present

## 2019-09-03 DIAGNOSIS — M9901 Segmental and somatic dysfunction of cervical region: Secondary | ICD-10-CM | POA: Diagnosis not present

## 2019-09-07 ENCOUNTER — Other Ambulatory Visit: Payer: Self-pay | Admitting: Gastroenterology

## 2019-09-07 DIAGNOSIS — K769 Liver disease, unspecified: Secondary | ICD-10-CM

## 2019-09-15 DIAGNOSIS — H6502 Acute serous otitis media, left ear: Secondary | ICD-10-CM | POA: Diagnosis not present

## 2019-09-18 ENCOUNTER — Emergency Department (HOSPITAL_BASED_OUTPATIENT_CLINIC_OR_DEPARTMENT_OTHER)
Admission: EM | Admit: 2019-09-18 | Discharge: 2019-09-18 | Disposition: A | Discharge status: Home / Self Care | Payer: BC Managed Care – PPO | Attending: Emergency Medicine | Admitting: Emergency Medicine

## 2019-09-18 ENCOUNTER — Other Ambulatory Visit: Payer: Self-pay

## 2019-09-18 ENCOUNTER — Encounter (HOSPITAL_BASED_OUTPATIENT_CLINIC_OR_DEPARTMENT_OTHER): Payer: Self-pay

## 2019-09-18 DIAGNOSIS — G510 Bell's palsy: Secondary | ICD-10-CM

## 2019-09-18 DIAGNOSIS — E785 Hyperlipidemia, unspecified: Secondary | ICD-10-CM | POA: Diagnosis not present

## 2019-09-18 DIAGNOSIS — Z880 Allergy status to penicillin: Secondary | ICD-10-CM | POA: Diagnosis not present

## 2019-09-18 DIAGNOSIS — Z87891 Personal history of nicotine dependence: Secondary | ICD-10-CM | POA: Diagnosis not present

## 2019-09-18 DIAGNOSIS — Z79899 Other long term (current) drug therapy: Secondary | ICD-10-CM | POA: Diagnosis not present

## 2019-09-18 DIAGNOSIS — R29810 Facial weakness: Secondary | ICD-10-CM | POA: Diagnosis not present

## 2019-09-18 DIAGNOSIS — R200 Anesthesia of skin: Secondary | ICD-10-CM | POA: Diagnosis not present

## 2019-09-18 DIAGNOSIS — Z885 Allergy status to narcotic agent status: Secondary | ICD-10-CM | POA: Diagnosis not present

## 2019-09-18 DIAGNOSIS — Z888 Allergy status to other drugs, medicaments and biological substances status: Secondary | ICD-10-CM | POA: Diagnosis not present

## 2019-09-18 DIAGNOSIS — H16142 Punctate keratitis, left eye: Secondary | ICD-10-CM | POA: Diagnosis not present

## 2019-09-18 MED ORDER — VALACYCLOVIR HCL 1 G PO TABS: 1000.0000 mg | ORAL_TABLET | Freq: Three times a day (TID) | ORAL | 0 refills | Status: DC

## 2019-09-18 MED ORDER — VALACYCLOVIR HCL 500 MG PO TABS
1000.0000 mg | ORAL_TABLET | Freq: Once | ORAL | Status: AC
  Administered 2019-09-18: 20:00:00 1000 mg via ORAL
  Filled 2019-09-18: qty 2

## 2019-09-18 NOTE — Discharge Instructions (Addendum)
1.  Continue taking your prescribed Decadron.  Start taking valacyclovir as prescribed.  Your first dose was given in the emergency department.  You may start it first thing in the morning. 2.  Protect your eye with lubricant and take it closed at night. 3.  Follow-up with neurology as planned on Monday.  Return to the emergency department if new or worsening symptoms develop.

## 2019-09-18 NOTE — ED Triage Notes (Signed)
Pt reports numbness to tongue then pain behind left ear-seen at [facility]-started on abx and steriod-yesterday twitching to left yesterday-woke this am with left side facial numbness and "droopy"-states PCP advised to come to ED "tonight or tomorrow"-NAD-steady gait

## 2019-09-18 NOTE — ED Provider Notes (Signed)
[facility] EMERGENCY DEPARTMENT Provider Note   CSN: [id number] Arrival date & time: 09/18/19  1906     History Chief Complaint  Patient presents with  . Numbness    Cheryl [name] is a 29 y.o. female.  HPI Patient reports Tuesday, 3 days ago, she started to get an odd sense of loss of taste on the left side of her tongue and a uncomfortable sensation behind her left ear (she indicates around the mastoid).  She was seen in urgent care and diagnosed with an ear infection.  She was started on cefdinir and Decadron.  She has been taking those for 3 days now.  However, she is started noticed that her left mouth was getting droopy and not moving the right way.  Her tongue has a metallic taste on half of it.  Her eye has stopped closing normally and has some clear watering and feels like it twitches sometimes on the left.  She did have a televisit with her doctor today but was told to come to the emergency department for evaluation to confirm that this was a Bell's palsy or rule out other etiology.  Patient has not had any fever.  No headache.  No weakness numbness tingling of extremities.  No gait disturbance.  Symptoms have worsened since onset with pronounced droop of the left mouth and left eye not closing normally.    Past Medical History:  Diagnosis Date  . Anxiety   . Chiari malformation type I (HCC)   . Gallbladder attack   . GERD (gastroesophageal reflux disease)   . Hyperlipidemia   . Insomnia   . Liver cyst   . Medical history non-contributory   . Panic attack   . PCOS (polycystic ovarian syndrome)     Patient Active Problem List   Diagnosis Date Noted  . Indication for care in labor or delivery 06/13/2017  . Vaginal delivery 01/31/2014  . Small for gestational age fetus affecting mother, antepartum 01/29/2014    Past Surgical History:  Procedure Laterality Date  . TONSILLECTOMY  2015     OB History    Gravida  2   Para  2   Term  2   Preterm    0   AB  0   Living  2     SAB  0   TAB  0   Ectopic  0   Multiple  0   Live Births  2           Family History  Problem Relation Age of Onset  . Diabetes Maternal Grandmother   . Cancer Maternal Grandmother   . Hyperlipidemia Maternal Grandmother   . Hyperlipidemia Paternal Grandmother   . Diabetes Paternal Grandmother   . Parkinson's disease Paternal Grandmother   . Diabetes Paternal Grandfather   . Hyperlipidemia Paternal Grandfather   . Hypertension Mother   . Depression Mother   . Parkinson's disease Mother   . Thyroid disease Mother   . Diabetes Father   . Hypertension Father   . Birth defects Sister        heart defect brought on by labor    Social History   Tobacco Use  . Smoking status: Former Smoker    Types: Cigarettes    Quit date: 12/03/2010    Years since quitting: 8.7  . Smokeless tobacco: Never Used  Substance Use Topics  . Alcohol use: Not Currently  . Drug use: No    Home Medications Prior to  Admission medications   Medication Sig Start Date End Date Taking? Authorizing Provider  acetaminophen (TYLENOL) 325 MG tablet Take 650 mg by mouth every 6 (six) hours as needed for headache.     [provider]  calcium carbonate (TUMS - DOSED IN MG ELEMENTAL CALCIUM) 500 MG chewable tablet Chew 2-4 tablets by mouth daily as needed for indigestion or heartburn.     [provider]  Cholecalciferol (D3-50) 1.25 MG (50000 UT) capsule Take by mouth. 07/18/18   [provider]  famotidine (PEPCID) 20 MG tablet Take 20 mg by mouth at bedtime.    [provider]  hydrOXYzine (VISTARIL) 50 MG capsule TAKE 1 CAPSULE BY MOUTH EVERY 6 HOURS AS NEEDED 11/03/18   [provider]  ibuprofen (ADVIL,MOTRIN) 600 MG tablet Take 1 tablet (600 mg total) every 6 (six) hours by mouth. 06/15/17   Clemmons, Lori A, CNM  LATUDA 20 MG TABS tablet TAKE 1 2 (ONE HALF) TABLET BY MOUTH ONCE DAILY FOR 4 DAYS AND 1 TABLETS ONCE DAILY  FOR 4 DAYS AND 2 TABLETS ONCE DAILY TAKE WITH 350 CAL. ME 11/03/18   [provider]  LATUDA 40 MG TABS tablet 12/03/18 not started yet 11/03/18   [provider]  Melatonin (MELATONIN MAXIMUM STRENGTH) 5 MG TABS Take by mouth as needed.    [provider]  metFORMIN (GLUCOPHAGE) 500 MG tablet Take 500 mg by mouth daily. 02/03/18   [provider]  Omega-3 Fatty Acids (FISH OIL PO) Take by mouth.    [provider]  pantoprazole (PROTONIX) 20 MG tablet Take 1 tablet (20 mg total) by mouth daily for 7 days. 03/09/19 03/16/19  Couture, Cortni S, PA-C  Prenatal MV-Min-FA-Omega-3 (PRENATAL GUMMIES/DHA & FA PO) Take 2 tablets by mouth daily.    [provider]  sucralfate (CARAFATE) 1 g tablet Take 1 tablet (1 g total) by mouth 4 (four) times daily -  with meals and at bedtime for 7 days. 03/09/19 03/16/19  Couture, Cortni S, PA-C  valACYclovir (VALTREX) 1000 MG tablet Take 1 tablet (1,000 mg total) by mouth 3 (three) times daily. 09/18/19   Charlesetta Shanks, MD  zaleplon (SONATA) 10 MG capsule TAKE 1 CAPSULE BY MOUTH EVERY DAY AT BEDTIME AS NEEDED DO NOT TAKE WITHIN 30 MINUTES OF EATING 01/08/19   [provider]    Allergies    Amoxicillin, Effexor [venlafaxine], Erythromycin, Oxycontin [oxycodone hcl], Vicodin [hydrocodone-acetaminophen], Bupropion, and Mold extract [trichophyton]  Review of Systems   Review of Systems 10 Systems reviewed and are negative for acute change except as noted in the HPI.  Physical Exam Updated Vital Signs BP (!) 133/94 (BP Location: Left Arm)   Pulse (!) 130 Comment: pt reports hx of "tachycardia-and when I feel anxious"  Temp 98.5 F (36.9 C) (Oral)   Resp 18   Ht 5\' 5"  (1.651 m)   Wt 105.2 kg   SpO2 100%   BMI 38.61 kg/m   Physical Exam Constitutional:      Comments: Alert appropriate no acute distress.  HENT:     Head: Normocephalic and atraumatic.     Right Ear: Tympanic membrane normal.     Left Ear:  Tympanic membrane normal.     Nose: Nose normal.     Mouth/Throat:     Mouth: Mucous membranes are moist.     Pharynx: Oropharynx is clear.     Comments: Dentition in good condition.  Mucous membranes normal.  Posterior oropharynx widely patent.  Eyes:     Extraocular Movements: Extraocular movements intact.     Conjunctiva/sclera: Conjunctivae normal.     Pupils: Pupils are equal, round, and reactive to light.     Comments: Patient has significantly decreased closure and blinking on the left.  Extraocular motions are normal.  Pupillary responses and consensual response normal.  Cardiovascular:     Rate and Rhythm: Normal rate and regular rhythm.  Pulmonary:     Effort: Pulmonary effort is normal.     Breath sounds: Normal breath sounds.  Abdominal:     General: There is no distension.     Palpations: Abdomen is soft.     Tenderness: There is no abdominal tenderness. There is no guarding.  Musculoskeletal:        General: No swelling or tenderness. Normal range of motion.     Cervical back: Neck supple.     Right lower leg: No edema.     Left lower leg: No edema.  Lymphadenopathy:     Cervical: No cervical adenopathy.  Skin:    General: Skin is warm and dry.  Neurological:     Comments: Patient is alert and appropriate.  Normal mental status.  Speech clear.  Normal content.  Patient has distinct cranial nerve VII dysfunction.  She has significantly decreased blinking on the left.  Decreased brow for on the left.  Significant asymmetry of the mouth on the left.  Normal finger-nose exam.  Normal heel shin exam.  Normal motor strength.  Sensation intact to light touch x4 extremities.  Psychiatric:        Mood and Affect: Mood normal.       ED Results / Procedures / Treatments   Labs (all labs ordered are listed, but only abnormal results are displayed) Labs Reviewed - No data to display  EKG None  Radiology No results found.  Procedures Procedures (including critical  care time)  Medications Ordered in ED Medications  valACYclovir (VALTREX) tablet 1,000 mg (has no administration in time range)    ED Course  I have reviewed the triage vital signs and the nursing notes.  Pertinent labs & imaging results that were available during my care of the patient were reviewed by me and considered in my medical decision making (see chart for details).    MDM Rules/Calculators/A&P                      Patient was started on Decadron and cefdinir 3 days ago.  At this time, with involvement of both the forehead, eye and significant mouth droop will add valacyclovir.  Patient is otherwise clinically well.  She does not have fever, headache or other neurologic deficits.  She is already scheduled for follow-up with neurology on Monday.  Return precautions reviewed. Final Clinical Impression(s) / ED Diagnoses Final diagnoses:  Bell's palsy    Rx / DC Orders ED Discharge Orders         Ordered    valACYclovir (VALTREX) 1000 MG tablet  3 times daily     09/18/19 1945           Arby Barrette, MD 09/18/19 1956

## 2019-09-21 DIAGNOSIS — R5383 Other fatigue: Secondary | ICD-10-CM | POA: Diagnosis not present

## 2019-09-21 DIAGNOSIS — R200 Anesthesia of skin: Secondary | ICD-10-CM | POA: Diagnosis not present

## 2019-09-21 DIAGNOSIS — Q0700 Arnold-Chiari syndrome without spina bifida or hydrocephalus: Secondary | ICD-10-CM | POA: Diagnosis not present

## 2019-09-21 DIAGNOSIS — G510 Bell's palsy: Secondary | ICD-10-CM | POA: Diagnosis not present

## 2019-11-16 DIAGNOSIS — Z20828 Contact with and (suspected) exposure to other viral communicable diseases: Secondary | ICD-10-CM | POA: Diagnosis not present

## 2019-11-23 DIAGNOSIS — R200 Anesthesia of skin: Secondary | ICD-10-CM | POA: Diagnosis not present

## 2019-11-23 DIAGNOSIS — R519 Headache, unspecified: Secondary | ICD-10-CM | POA: Diagnosis not present

## 2020-01-20 DIAGNOSIS — Z808 Family history of malignant neoplasm of other organs or systems: Secondary | ICD-10-CM | POA: Diagnosis not present

## 2020-01-20 DIAGNOSIS — D229 Melanocytic nevi, unspecified: Secondary | ICD-10-CM | POA: Diagnosis not present

## 2020-01-28 ENCOUNTER — Other Ambulatory Visit: Payer: Self-pay | Admitting: Gastroenterology

## 2020-01-28 ENCOUNTER — Other Ambulatory Visit (HOSPITAL_COMMUNITY): Payer: Self-pay | Admitting: Gastroenterology

## 2020-01-28 DIAGNOSIS — R1011 Right upper quadrant pain: Secondary | ICD-10-CM

## 2020-01-28 DIAGNOSIS — K769 Liver disease, unspecified: Secondary | ICD-10-CM | POA: Diagnosis not present

## 2020-01-28 DIAGNOSIS — K219 Gastro-esophageal reflux disease without esophagitis: Secondary | ICD-10-CM | POA: Diagnosis not present

## 2020-02-04 ENCOUNTER — Ambulatory Visit (HOSPITAL_COMMUNITY)
Admission: RE | Admit: 2020-02-04 | Discharge: 2020-02-04 | Disposition: A | Discharge status: Home / Self Care | Payer: BC Managed Care – PPO | Source: Ambulatory Visit | Attending: Gastroenterology | Admitting: Gastroenterology

## 2020-02-04 ENCOUNTER — Other Ambulatory Visit: Payer: Self-pay

## 2020-02-04 DIAGNOSIS — R1011 Right upper quadrant pain: Secondary | ICD-10-CM | POA: Diagnosis not present

## 2020-02-04 MED ORDER — TECHNETIUM TC 99M MEBROFENIN IV KIT
5.4000 | PACK | Freq: Once | INTRAVENOUS | Status: AC | PRN
  Administered 2020-02-04: 5.4 via INTRAVENOUS

## 2020-02-05 DIAGNOSIS — Z124 Encounter for screening for malignant neoplasm of cervix: Secondary | ICD-10-CM | POA: Diagnosis not present

## 2020-02-05 DIAGNOSIS — Z30433 Encounter for removal and reinsertion of intrauterine contraceptive device: Secondary | ICD-10-CM | POA: Diagnosis not present

## 2020-02-05 DIAGNOSIS — Z131 Encounter for screening for diabetes mellitus: Secondary | ICD-10-CM | POA: Diagnosis not present

## 2020-02-05 DIAGNOSIS — Z01419 Encounter for gynecological examination (general) (routine) without abnormal findings: Secondary | ICD-10-CM | POA: Diagnosis not present

## 2020-02-05 DIAGNOSIS — E785 Hyperlipidemia, unspecified: Secondary | ICD-10-CM | POA: Diagnosis not present

## 2020-02-12 ENCOUNTER — Other Ambulatory Visit: Payer: Self-pay | Admitting: Gastroenterology

## 2020-02-12 DIAGNOSIS — K769 Liver disease, unspecified: Secondary | ICD-10-CM

## 2020-02-13 DIAGNOSIS — S76111A Strain of right quadriceps muscle, fascia and tendon, initial encounter: Secondary | ICD-10-CM | POA: Diagnosis not present

## 2020-02-15 DIAGNOSIS — M25551 Pain in right hip: Secondary | ICD-10-CM | POA: Diagnosis not present

## 2020-02-26 DIAGNOSIS — R002 Palpitations: Secondary | ICD-10-CM | POA: Diagnosis not present

## 2020-03-03 DIAGNOSIS — F410 Panic disorder [episodic paroxysmal anxiety] without agoraphobia: Secondary | ICD-10-CM | POA: Diagnosis not present

## 2020-03-03 DIAGNOSIS — F3181 Bipolar II disorder: Secondary | ICD-10-CM | POA: Diagnosis not present

## 2020-03-07 DIAGNOSIS — M25551 Pain in right hip: Secondary | ICD-10-CM | POA: Diagnosis not present

## 2020-03-15 ENCOUNTER — Other Ambulatory Visit: Payer: BC Managed Care – PPO

## 2020-03-16 DIAGNOSIS — Z30431 Encounter for routine checking of intrauterine contraceptive device: Secondary | ICD-10-CM | POA: Diagnosis not present

## 2020-03-21 DIAGNOSIS — M898X1 Other specified disorders of bone, shoulder: Secondary | ICD-10-CM | POA: Diagnosis not present

## 2020-03-28 DIAGNOSIS — M25512 Pain in left shoulder: Secondary | ICD-10-CM | POA: Diagnosis not present

## 2020-03-29 DIAGNOSIS — I491 Atrial premature depolarization: Secondary | ICD-10-CM | POA: Diagnosis not present

## 2020-03-29 DIAGNOSIS — I493 Ventricular premature depolarization: Secondary | ICD-10-CM | POA: Diagnosis not present

## 2020-03-31 DIAGNOSIS — F410 Panic disorder [episodic paroxysmal anxiety] without agoraphobia: Secondary | ICD-10-CM | POA: Diagnosis not present

## 2020-03-31 DIAGNOSIS — F3181 Bipolar II disorder: Secondary | ICD-10-CM | POA: Diagnosis not present

## 2020-04-01 DIAGNOSIS — M25512 Pain in left shoulder: Secondary | ICD-10-CM | POA: Diagnosis not present

## 2020-04-04 DIAGNOSIS — Z6839 Body mass index (BMI) 39.0-39.9, adult: Secondary | ICD-10-CM | POA: Diagnosis not present

## 2020-04-04 DIAGNOSIS — Z0000 Encounter for general adult medical examination without abnormal findings: Secondary | ICD-10-CM | POA: Diagnosis not present

## 2020-04-04 DIAGNOSIS — R000 Tachycardia, unspecified: Secondary | ICD-10-CM | POA: Diagnosis not present

## 2020-04-05 DIAGNOSIS — R079 Chest pain, unspecified: Secondary | ICD-10-CM | POA: Diagnosis not present

## 2020-04-17 DIAGNOSIS — R000 Tachycardia, unspecified: Secondary | ICD-10-CM | POA: Diagnosis not present

## 2020-04-17 DIAGNOSIS — R002 Palpitations: Secondary | ICD-10-CM | POA: Diagnosis not present

## 2020-04-17 DIAGNOSIS — I10 Essential (primary) hypertension: Secondary | ICD-10-CM | POA: Diagnosis not present

## 2020-04-18 DIAGNOSIS — R9439 Abnormal result of other cardiovascular function study: Secondary | ICD-10-CM | POA: Diagnosis not present

## 2020-04-18 DIAGNOSIS — R000 Tachycardia, unspecified: Secondary | ICD-10-CM | POA: Diagnosis not present

## 2020-04-21 DIAGNOSIS — R519 Headache, unspecified: Secondary | ICD-10-CM | POA: Diagnosis not present

## 2020-04-21 DIAGNOSIS — R200 Anesthesia of skin: Secondary | ICD-10-CM | POA: Diagnosis not present

## 2020-04-21 DIAGNOSIS — Q0700 Arnold-Chiari syndrome without spina bifida or hydrocephalus: Secondary | ICD-10-CM | POA: Diagnosis not present

## 2020-04-22 DIAGNOSIS — R000 Tachycardia, unspecified: Secondary | ICD-10-CM | POA: Diagnosis not present

## 2020-04-25 DIAGNOSIS — Z6841 Body Mass Index (BMI) 40.0 and over, adult: Secondary | ICD-10-CM | POA: Diagnosis not present

## 2020-06-13 DIAGNOSIS — G9389 Other specified disorders of brain: Secondary | ICD-10-CM | POA: Diagnosis not present

## 2020-06-13 DIAGNOSIS — R29810 Facial weakness: Secondary | ICD-10-CM | POA: Diagnosis not present

## 2020-06-13 DIAGNOSIS — R202 Paresthesia of skin: Secondary | ICD-10-CM | POA: Diagnosis not present

## 2020-06-13 DIAGNOSIS — R200 Anesthesia of skin: Secondary | ICD-10-CM | POA: Diagnosis not present

## 2020-06-13 DIAGNOSIS — Q048 Other specified congenital malformations of brain: Secondary | ICD-10-CM | POA: Diagnosis not present

## 2020-06-13 DIAGNOSIS — G935 Compression of brain: Secondary | ICD-10-CM | POA: Diagnosis not present

## 2020-06-27 DIAGNOSIS — R531 Weakness: Secondary | ICD-10-CM | POA: Diagnosis not present

## 2020-06-27 DIAGNOSIS — M25512 Pain in left shoulder: Secondary | ICD-10-CM | POA: Diagnosis not present

## 2020-06-27 DIAGNOSIS — M2569 Stiffness of other specified joint, not elsewhere classified: Secondary | ICD-10-CM | POA: Diagnosis not present

## 2020-06-27 DIAGNOSIS — M546 Pain in thoracic spine: Secondary | ICD-10-CM | POA: Diagnosis not present

## 2020-07-16 ENCOUNTER — Ambulatory Visit
Admission: RE | Admit: 2020-07-16 | Discharge: 2020-07-16 | Disposition: A | Discharge status: Home / Self Care | Payer: BC Managed Care – PPO | Source: Ambulatory Visit | Attending: Gastroenterology | Admitting: Gastroenterology

## 2020-07-16 ENCOUNTER — Other Ambulatory Visit: Payer: Self-pay

## 2020-07-16 DIAGNOSIS — K769 Liver disease, unspecified: Secondary | ICD-10-CM

## 2020-07-16 MED ORDER — GADOXETATE DISODIUM 0.25 MMOL/ML IV SOLN
5.0000 mL | Freq: Once | INTRAVENOUS | Status: AC | PRN
  Administered 2020-07-16: 5 mL via INTRAVENOUS

## 2020-07-23 IMAGING — NM NM HEPATO W/GB/PHARM/[PERSON_NAME]
2 series · 12 of 12 positions shown · non-contrast
Comparison: Ultrasound 03/09/2019

CLINICAL DATA: Right upper quadrant pain

EXAM:
NUCLEAR MEDICINE HEPATOBILIARY IMAGING WITH GALLBLADDER EF
TECHNIQUE: Sequential images of the abdomen were obtained [DATE] minutes
following intravenous administration of radiopharmaceutical. After
oral ingestion of Ensure, gallbladder ejection fraction was
determined. At 60 min, normal ejection fraction is greater than 33%.
RADIOPHARMACEUTICALS:  5.4 mCi Cc-ZZm  Choletec IV

[Series 1: biliary · 4.14mm/px · 6 of 60 frames shown (1 of 2)]
[frame 6/60]
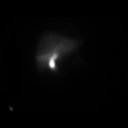
[frame 16/60]
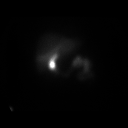
[frame 26/60]
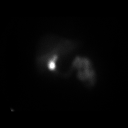
[frame 36/60]
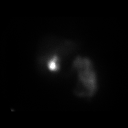
[frame 46/60]
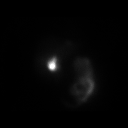
[frame 56/60]
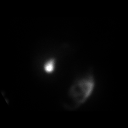

[Series 2: biliary · 4.14mm/px · 6 of 60 frames shown (2 of 2)]
[frame 6/60]
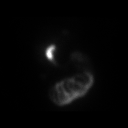
[frame 16/60]
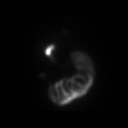
[frame 26/60]
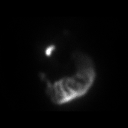
[frame 36/60]
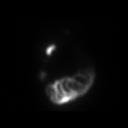
[frame 46/60]
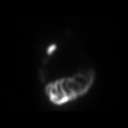
[frame 56/60]
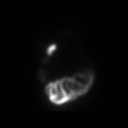

[12 of 12 positions shown; findings below may reference images not displayed]

FINDINGS: Prompt uptake and biliary excretion of activity by the liver is
seen. Gallbladder activity is visualized, consistent with patency of
cystic duct. Biliary activity passes into small bowel, consistent
with patent common bile duct.

Calculated gallbladder ejection fraction is 66%. (Normal gallbladder
ejection fraction with Ensure is greater than 33%.) Patient
complained of pain post Ensure.
IMPRESSION: Negative examination.

## 2020-07-25 DIAGNOSIS — R531 Weakness: Secondary | ICD-10-CM | POA: Diagnosis not present

## 2020-07-25 DIAGNOSIS — M2569 Stiffness of other specified joint, not elsewhere classified: Secondary | ICD-10-CM | POA: Diagnosis not present

## 2020-07-25 DIAGNOSIS — R1011 Right upper quadrant pain: Secondary | ICD-10-CM | POA: Diagnosis not present

## 2020-07-25 DIAGNOSIS — K7689 Other specified diseases of liver: Secondary | ICD-10-CM | POA: Diagnosis not present

## 2020-07-25 DIAGNOSIS — M25512 Pain in left shoulder: Secondary | ICD-10-CM | POA: Diagnosis not present

## 2020-07-25 DIAGNOSIS — R110 Nausea: Secondary | ICD-10-CM | POA: Diagnosis not present

## 2020-07-25 DIAGNOSIS — M546 Pain in thoracic spine: Secondary | ICD-10-CM | POA: Diagnosis not present

## 2020-07-26 DIAGNOSIS — K7689 Other specified diseases of liver: Secondary | ICD-10-CM | POA: Diagnosis not present

## 2020-07-26 DIAGNOSIS — R1011 Right upper quadrant pain: Secondary | ICD-10-CM | POA: Diagnosis not present

## 2020-07-26 DIAGNOSIS — K219 Gastro-esophageal reflux disease without esophagitis: Secondary | ICD-10-CM | POA: Diagnosis not present

## 2020-08-08 DIAGNOSIS — M546 Pain in thoracic spine: Secondary | ICD-10-CM | POA: Diagnosis not present

## 2020-08-08 DIAGNOSIS — R531 Weakness: Secondary | ICD-10-CM | POA: Diagnosis not present

## 2020-08-08 DIAGNOSIS — M25512 Pain in left shoulder: Secondary | ICD-10-CM | POA: Diagnosis not present

## 2020-08-08 DIAGNOSIS — M2569 Stiffness of other specified joint, not elsewhere classified: Secondary | ICD-10-CM | POA: Diagnosis not present

## 2020-08-26 DIAGNOSIS — R000 Tachycardia, unspecified: Secondary | ICD-10-CM | POA: Diagnosis not present

## 2020-08-30 DIAGNOSIS — R531 Weakness: Secondary | ICD-10-CM | POA: Diagnosis not present

## 2020-08-30 DIAGNOSIS — M25512 Pain in left shoulder: Secondary | ICD-10-CM | POA: Diagnosis not present

## 2020-08-30 DIAGNOSIS — M2569 Stiffness of other specified joint, not elsewhere classified: Secondary | ICD-10-CM | POA: Diagnosis not present

## 2020-08-30 DIAGNOSIS — M546 Pain in thoracic spine: Secondary | ICD-10-CM | POA: Diagnosis not present

## 2020-09-02 DIAGNOSIS — R1011 Right upper quadrant pain: Secondary | ICD-10-CM | POA: Diagnosis not present

## 2020-09-05 DIAGNOSIS — R531 Weakness: Secondary | ICD-10-CM | POA: Diagnosis not present

## 2020-09-05 DIAGNOSIS — M2569 Stiffness of other specified joint, not elsewhere classified: Secondary | ICD-10-CM | POA: Diagnosis not present

## 2020-09-05 DIAGNOSIS — M546 Pain in thoracic spine: Secondary | ICD-10-CM | POA: Diagnosis not present

## 2020-09-05 DIAGNOSIS — M25512 Pain in left shoulder: Secondary | ICD-10-CM | POA: Diagnosis not present

## 2020-09-06 DIAGNOSIS — Q0700 Arnold-Chiari syndrome without spina bifida or hydrocephalus: Secondary | ICD-10-CM | POA: Diagnosis not present

## 2020-09-06 DIAGNOSIS — G510 Bell's palsy: Secondary | ICD-10-CM | POA: Diagnosis not present

## 2020-09-06 DIAGNOSIS — R200 Anesthesia of skin: Secondary | ICD-10-CM | POA: Diagnosis not present

## 2020-09-08 DIAGNOSIS — R110 Nausea: Secondary | ICD-10-CM | POA: Diagnosis not present

## 2020-09-08 DIAGNOSIS — R195 Other fecal abnormalities: Secondary | ICD-10-CM | POA: Diagnosis not present

## 2020-09-09 DIAGNOSIS — R195 Other fecal abnormalities: Secondary | ICD-10-CM | POA: Diagnosis not present

## 2020-09-12 DIAGNOSIS — M25512 Pain in left shoulder: Secondary | ICD-10-CM | POA: Diagnosis not present

## 2020-09-12 DIAGNOSIS — M2569 Stiffness of other specified joint, not elsewhere classified: Secondary | ICD-10-CM | POA: Diagnosis not present

## 2020-09-12 DIAGNOSIS — R531 Weakness: Secondary | ICD-10-CM | POA: Diagnosis not present

## 2020-09-12 DIAGNOSIS — M546 Pain in thoracic spine: Secondary | ICD-10-CM | POA: Diagnosis not present

## 2020-09-14 DIAGNOSIS — M25512 Pain in left shoulder: Secondary | ICD-10-CM | POA: Diagnosis not present

## 2020-09-14 DIAGNOSIS — M542 Cervicalgia: Secondary | ICD-10-CM | POA: Diagnosis not present

## 2020-09-19 DIAGNOSIS — M546 Pain in thoracic spine: Secondary | ICD-10-CM | POA: Diagnosis not present

## 2020-09-19 DIAGNOSIS — M25512 Pain in left shoulder: Secondary | ICD-10-CM | POA: Diagnosis not present

## 2020-09-19 DIAGNOSIS — R531 Weakness: Secondary | ICD-10-CM | POA: Diagnosis not present

## 2020-09-19 DIAGNOSIS — M2569 Stiffness of other specified joint, not elsewhere classified: Secondary | ICD-10-CM | POA: Diagnosis not present

## 2020-09-20 DIAGNOSIS — D2239 Melanocytic nevi of other parts of face: Secondary | ICD-10-CM | POA: Diagnosis not present

## 2020-09-20 DIAGNOSIS — D2261 Melanocytic nevi of right upper limb, including shoulder: Secondary | ICD-10-CM | POA: Diagnosis not present

## 2020-09-21 DIAGNOSIS — M25512 Pain in left shoulder: Secondary | ICD-10-CM | POA: Diagnosis not present

## 2020-09-28 DIAGNOSIS — M2569 Stiffness of other specified joint, not elsewhere classified: Secondary | ICD-10-CM | POA: Diagnosis not present

## 2020-09-28 DIAGNOSIS — R531 Weakness: Secondary | ICD-10-CM | POA: Diagnosis not present

## 2020-09-28 DIAGNOSIS — M546 Pain in thoracic spine: Secondary | ICD-10-CM | POA: Diagnosis not present

## 2020-09-28 DIAGNOSIS — M25512 Pain in left shoulder: Secondary | ICD-10-CM | POA: Diagnosis not present

## 2020-10-10 DIAGNOSIS — M546 Pain in thoracic spine: Secondary | ICD-10-CM | POA: Diagnosis not present

## 2020-10-10 DIAGNOSIS — M25512 Pain in left shoulder: Secondary | ICD-10-CM | POA: Diagnosis not present

## 2020-10-10 DIAGNOSIS — M2569 Stiffness of other specified joint, not elsewhere classified: Secondary | ICD-10-CM | POA: Diagnosis not present

## 2020-10-10 DIAGNOSIS — R531 Weakness: Secondary | ICD-10-CM | POA: Diagnosis not present

## 2020-10-24 DIAGNOSIS — R531 Weakness: Secondary | ICD-10-CM | POA: Diagnosis not present

## 2020-10-24 DIAGNOSIS — M546 Pain in thoracic spine: Secondary | ICD-10-CM | POA: Diagnosis not present

## 2020-10-24 DIAGNOSIS — M2569 Stiffness of other specified joint, not elsewhere classified: Secondary | ICD-10-CM | POA: Diagnosis not present

## 2020-10-24 DIAGNOSIS — M25512 Pain in left shoulder: Secondary | ICD-10-CM | POA: Diagnosis not present

## 2021-01-24 DIAGNOSIS — K219 Gastro-esophageal reflux disease without esophagitis: Secondary | ICD-10-CM | POA: Diagnosis not present

## 2021-01-24 DIAGNOSIS — K7689 Other specified diseases of liver: Secondary | ICD-10-CM | POA: Diagnosis not present

## 2021-02-10 DIAGNOSIS — M25512 Pain in left shoulder: Secondary | ICD-10-CM | POA: Diagnosis not present

## 2021-02-10 DIAGNOSIS — M546 Pain in thoracic spine: Secondary | ICD-10-CM | POA: Diagnosis not present

## 2021-02-10 DIAGNOSIS — M2569 Stiffness of other specified joint, not elsewhere classified: Secondary | ICD-10-CM | POA: Diagnosis not present

## 2021-02-10 DIAGNOSIS — R531 Weakness: Secondary | ICD-10-CM | POA: Diagnosis not present

## 2021-02-13 DIAGNOSIS — M2569 Stiffness of other specified joint, not elsewhere classified: Secondary | ICD-10-CM | POA: Diagnosis not present

## 2021-02-13 DIAGNOSIS — R531 Weakness: Secondary | ICD-10-CM | POA: Diagnosis not present

## 2021-02-13 DIAGNOSIS — M25512 Pain in left shoulder: Secondary | ICD-10-CM | POA: Diagnosis not present

## 2021-02-13 DIAGNOSIS — M546 Pain in thoracic spine: Secondary | ICD-10-CM | POA: Diagnosis not present

## 2021-02-14 DIAGNOSIS — Z01419 Encounter for gynecological examination (general) (routine) without abnormal findings: Secondary | ICD-10-CM | POA: Diagnosis not present

## 2021-02-14 DIAGNOSIS — R102 Pelvic and perineal pain: Secondary | ICD-10-CM | POA: Diagnosis not present

## 2021-02-14 DIAGNOSIS — N946 Dysmenorrhea, unspecified: Secondary | ICD-10-CM | POA: Diagnosis not present

## 2021-02-15 DIAGNOSIS — M546 Pain in thoracic spine: Secondary | ICD-10-CM | POA: Diagnosis not present

## 2021-02-15 DIAGNOSIS — M2569 Stiffness of other specified joint, not elsewhere classified: Secondary | ICD-10-CM | POA: Diagnosis not present

## 2021-02-15 DIAGNOSIS — R531 Weakness: Secondary | ICD-10-CM | POA: Diagnosis not present

## 2021-02-15 DIAGNOSIS — M25512 Pain in left shoulder: Secondary | ICD-10-CM | POA: Diagnosis not present

## 2021-02-16 DIAGNOSIS — K2900 Acute gastritis without bleeding: Secondary | ICD-10-CM | POA: Diagnosis not present

## 2021-02-16 DIAGNOSIS — R8290 Unspecified abnormal findings in urine: Secondary | ICD-10-CM | POA: Diagnosis not present

## 2021-02-16 DIAGNOSIS — R1013 Epigastric pain: Secondary | ICD-10-CM | POA: Diagnosis not present

## 2021-02-27 DIAGNOSIS — M25512 Pain in left shoulder: Secondary | ICD-10-CM | POA: Diagnosis not present

## 2021-02-27 DIAGNOSIS — R531 Weakness: Secondary | ICD-10-CM | POA: Diagnosis not present

## 2021-02-27 DIAGNOSIS — M546 Pain in thoracic spine: Secondary | ICD-10-CM | POA: Diagnosis not present

## 2021-02-27 DIAGNOSIS — M2569 Stiffness of other specified joint, not elsewhere classified: Secondary | ICD-10-CM | POA: Diagnosis not present

## 2021-03-17 DIAGNOSIS — R197 Diarrhea, unspecified: Secondary | ICD-10-CM | POA: Diagnosis not present

## 2021-03-17 DIAGNOSIS — M25512 Pain in left shoulder: Secondary | ICD-10-CM | POA: Diagnosis not present

## 2021-03-17 DIAGNOSIS — M546 Pain in thoracic spine: Secondary | ICD-10-CM | POA: Diagnosis not present

## 2021-03-17 DIAGNOSIS — M2569 Stiffness of other specified joint, not elsewhere classified: Secondary | ICD-10-CM | POA: Diagnosis not present

## 2021-03-17 DIAGNOSIS — R1012 Left upper quadrant pain: Secondary | ICD-10-CM | POA: Diagnosis not present

## 2021-03-17 DIAGNOSIS — R000 Tachycardia, unspecified: Secondary | ICD-10-CM | POA: Diagnosis not present

## 2021-03-17 DIAGNOSIS — R531 Weakness: Secondary | ICD-10-CM | POA: Diagnosis not present

## 2021-03-20 DIAGNOSIS — R102 Pelvic and perineal pain: Secondary | ICD-10-CM | POA: Diagnosis not present

## 2021-03-24 DIAGNOSIS — M2569 Stiffness of other specified joint, not elsewhere classified: Secondary | ICD-10-CM | POA: Diagnosis not present

## 2021-03-24 DIAGNOSIS — R531 Weakness: Secondary | ICD-10-CM | POA: Diagnosis not present

## 2021-03-24 DIAGNOSIS — M25512 Pain in left shoulder: Secondary | ICD-10-CM | POA: Diagnosis not present

## 2021-03-24 DIAGNOSIS — M546 Pain in thoracic spine: Secondary | ICD-10-CM | POA: Diagnosis not present

## 2021-03-29 DIAGNOSIS — R531 Weakness: Secondary | ICD-10-CM | POA: Diagnosis not present

## 2021-03-29 DIAGNOSIS — M25512 Pain in left shoulder: Secondary | ICD-10-CM | POA: Diagnosis not present

## 2021-03-29 DIAGNOSIS — M546 Pain in thoracic spine: Secondary | ICD-10-CM | POA: Diagnosis not present

## 2021-03-29 DIAGNOSIS — M2569 Stiffness of other specified joint, not elsewhere classified: Secondary | ICD-10-CM | POA: Diagnosis not present

## 2021-03-31 DIAGNOSIS — R000 Tachycardia, unspecified: Secondary | ICD-10-CM | POA: Diagnosis not present

## 2021-04-07 DIAGNOSIS — M25512 Pain in left shoulder: Secondary | ICD-10-CM | POA: Diagnosis not present

## 2021-04-07 DIAGNOSIS — M546 Pain in thoracic spine: Secondary | ICD-10-CM | POA: Diagnosis not present

## 2021-04-07 DIAGNOSIS — M2569 Stiffness of other specified joint, not elsewhere classified: Secondary | ICD-10-CM | POA: Diagnosis not present

## 2021-04-07 DIAGNOSIS — R531 Weakness: Secondary | ICD-10-CM | POA: Diagnosis not present

## 2021-04-12 DIAGNOSIS — Z13220 Encounter for screening for lipoid disorders: Secondary | ICD-10-CM | POA: Diagnosis not present

## 2021-04-12 DIAGNOSIS — Z0000 Encounter for general adult medical examination without abnormal findings: Secondary | ICD-10-CM | POA: Diagnosis not present

## 2021-04-13 ENCOUNTER — Other Ambulatory Visit: Payer: Self-pay | Admitting: Family Medicine

## 2021-04-13 DIAGNOSIS — M799 Soft tissue disorder, unspecified: Secondary | ICD-10-CM

## 2021-04-19 DIAGNOSIS — K219 Gastro-esophageal reflux disease without esophagitis: Secondary | ICD-10-CM | POA: Diagnosis not present

## 2021-04-26 ENCOUNTER — Other Ambulatory Visit: Payer: BC Managed Care – PPO

## 2021-04-28 DIAGNOSIS — R531 Weakness: Secondary | ICD-10-CM | POA: Diagnosis not present

## 2021-04-28 DIAGNOSIS — M25512 Pain in left shoulder: Secondary | ICD-10-CM | POA: Diagnosis not present

## 2021-04-28 DIAGNOSIS — M546 Pain in thoracic spine: Secondary | ICD-10-CM | POA: Diagnosis not present

## 2021-04-28 DIAGNOSIS — M2569 Stiffness of other specified joint, not elsewhere classified: Secondary | ICD-10-CM | POA: Diagnosis not present

## 2021-05-01 ENCOUNTER — Ambulatory Visit
Admission: RE | Admit: 2021-05-01 | Discharge: 2021-05-01 | Disposition: A | Discharge status: Home / Self Care | Payer: BC Managed Care – PPO | Source: Ambulatory Visit | Attending: Family Medicine | Admitting: Family Medicine

## 2021-05-01 DIAGNOSIS — M799 Soft tissue disorder, unspecified: Secondary | ICD-10-CM

## 2021-05-01 DIAGNOSIS — R2241 Localized swelling, mass and lump, right lower limb: Secondary | ICD-10-CM | POA: Diagnosis not present

## 2021-06-05 DIAGNOSIS — M25512 Pain in left shoulder: Secondary | ICD-10-CM | POA: Diagnosis not present

## 2021-06-05 DIAGNOSIS — M2569 Stiffness of other specified joint, not elsewhere classified: Secondary | ICD-10-CM | POA: Diagnosis not present

## 2021-06-05 DIAGNOSIS — M546 Pain in thoracic spine: Secondary | ICD-10-CM | POA: Diagnosis not present

## 2021-06-05 DIAGNOSIS — R531 Weakness: Secondary | ICD-10-CM | POA: Diagnosis not present

## 2021-06-16 DIAGNOSIS — R531 Weakness: Secondary | ICD-10-CM | POA: Diagnosis not present

## 2021-06-16 DIAGNOSIS — M2569 Stiffness of other specified joint, not elsewhere classified: Secondary | ICD-10-CM | POA: Diagnosis not present

## 2021-06-16 DIAGNOSIS — M25512 Pain in left shoulder: Secondary | ICD-10-CM | POA: Diagnosis not present

## 2021-06-16 DIAGNOSIS — M546 Pain in thoracic spine: Secondary | ICD-10-CM | POA: Diagnosis not present

## 2021-06-21 DIAGNOSIS — R1012 Left upper quadrant pain: Secondary | ICD-10-CM | POA: Diagnosis not present

## 2021-06-21 DIAGNOSIS — R142 Eructation: Secondary | ICD-10-CM | POA: Diagnosis not present

## 2021-06-21 DIAGNOSIS — K219 Gastro-esophageal reflux disease without esophagitis: Secondary | ICD-10-CM | POA: Diagnosis not present

## 2021-06-27 DIAGNOSIS — M546 Pain in thoracic spine: Secondary | ICD-10-CM | POA: Diagnosis not present

## 2021-06-27 DIAGNOSIS — M2569 Stiffness of other specified joint, not elsewhere classified: Secondary | ICD-10-CM | POA: Diagnosis not present

## 2021-06-27 DIAGNOSIS — R531 Weakness: Secondary | ICD-10-CM | POA: Diagnosis not present

## 2021-06-27 DIAGNOSIS — M25512 Pain in left shoulder: Secondary | ICD-10-CM | POA: Diagnosis not present

## 2021-07-02 DIAGNOSIS — J101 Influenza due to other identified influenza virus with other respiratory manifestations: Secondary | ICD-10-CM | POA: Diagnosis not present

## 2021-07-11 DIAGNOSIS — H9201 Otalgia, right ear: Secondary | ICD-10-CM | POA: Diagnosis not present

## 2021-07-11 DIAGNOSIS — R233 Spontaneous ecchymoses: Secondary | ICD-10-CM | POA: Diagnosis not present

## 2021-10-24 DIAGNOSIS — R110 Nausea: Secondary | ICD-10-CM | POA: Diagnosis not present

## 2021-10-24 DIAGNOSIS — R102 Pelvic and perineal pain: Secondary | ICD-10-CM | POA: Diagnosis not present

## 2021-10-25 DIAGNOSIS — R102 Pelvic and perineal pain: Secondary | ICD-10-CM | POA: Diagnosis not present

## 2021-11-27 ENCOUNTER — Other Ambulatory Visit: Payer: Self-pay

## 2021-11-27 ENCOUNTER — Encounter (HOSPITAL_BASED_OUTPATIENT_CLINIC_OR_DEPARTMENT_OTHER): Payer: Self-pay | Admitting: Emergency Medicine

## 2021-11-27 ENCOUNTER — Emergency Department (HOSPITAL_BASED_OUTPATIENT_CLINIC_OR_DEPARTMENT_OTHER)
Admission: EM | Admit: 2021-11-27 | Discharge: 2021-11-27 | Disposition: A | Discharge status: Home / Self Care | Payer: BC Managed Care – PPO | Attending: Emergency Medicine | Admitting: Emergency Medicine

## 2021-11-27 ENCOUNTER — Emergency Department (HOSPITAL_BASED_OUTPATIENT_CLINIC_OR_DEPARTMENT_OTHER): Payer: BC Managed Care – PPO

## 2021-11-27 DIAGNOSIS — R079 Chest pain, unspecified: Secondary | ICD-10-CM | POA: Diagnosis not present

## 2021-11-27 DIAGNOSIS — R000 Tachycardia, unspecified: Secondary | ICD-10-CM | POA: Diagnosis not present

## 2021-11-27 DIAGNOSIS — R0789 Other chest pain: Secondary | ICD-10-CM | POA: Insufficient documentation

## 2021-11-27 DIAGNOSIS — R072 Precordial pain: Secondary | ICD-10-CM | POA: Diagnosis not present

## 2021-11-27 DIAGNOSIS — R0602 Shortness of breath: Secondary | ICD-10-CM | POA: Insufficient documentation

## 2021-11-27 LAB — CBC
HCT: 41.8 % (ref 36.0–46.0)
Hemoglobin: 14.2 g/dL (ref 12.0–15.0)
MCH: 29 pg (ref 26.0–34.0)
MCHC: 34 g/dL (ref 30.0–36.0)
MCV: 85.5 fL (ref 80.0–100.0)
Platelets: 304 10*3/uL (ref 150–400)
RBC: 4.89 MIL/uL (ref 3.87–5.11)
RDW: 12.8 % (ref 11.5–15.5)
WBC: 6.5 10*3/uL (ref 4.0–10.5)
nRBC: 0 % (ref 0.0–0.2)

## 2021-11-27 LAB — BASIC METABOLIC PANEL
Anion gap: 7 (ref 5–15)
BUN: 8 mg/dL (ref 6–20)
CO2: 22 mmol/L (ref 22–32)
Calcium: 9.2 mg/dL (ref 8.9–10.3)
Chloride: 109 mmol/L (ref 98–111)
Creatinine, Ser: 0.77 mg/dL (ref 0.44–1.00)
GFR, Estimated: >60 mL/min (ref >60)
Glucose, Bld: 106 mg/dL — ABNORMAL HIGH (ref 70–99)
Potassium: 3.7 mmol/L (ref 3.5–5.1)
Sodium: 138 mmol/L (ref 135–145)

## 2021-11-27 LAB — PREGNANCY, URINE: Preg Test, Ur: NEGATIVE

## 2021-11-27 LAB — D-DIMER, QUANTITATIVE: D-Dimer, Quant: <0.27 ug/mL-FEU (ref 0.00–0.50)

## 2021-11-27 LAB — TROPONIN I (HIGH SENSITIVITY): Troponin I (High Sensitivity): <2 ng/L (ref <18)

## 2021-11-27 MED ORDER — SODIUM CHLORIDE 0.9 % IV BOLUS
1000.0000 mL | Freq: Once | INTRAVENOUS | Status: AC
  Administered 2021-11-27: 1000 mL via INTRAVENOUS

## 2021-11-27 NOTE — Discharge Instructions (Signed)
Return to the emergency room for new or worsening symptoms.  Follow-up with your doctor as scheduled on Friday. ?

## 2021-11-27 NOTE — ED Provider Notes (Signed)
?[facility] EMERGENCY DEPARTMENT ?Provider Note ? ? ?CSN: [id number] ?Arrival date & time: 11/27/21  1962 ? ?  ? ?History ? ?Chief Complaint  ?Patient presents with  ? Chest Pain  ? ? ?Cheryl [name] is a 31 y.o. female. ? ?31 year old female presents with mid sternal chest pain and SOB since Thursday (11/23/21). Pt's PMH include Sinus Tachycardia and hx of Panic Attacks. Pt presents with sternal chest pain and SOB since Thursday (11/23/21). She states that when she is exerting herself, the chest pain and SOB gets worse. Pt states that she has not been taking her Metoprolol consistently, but only takes it when she feels her heart rate getting up to 150 bpm. She called her cardiologist's office this morning for an appointment, but could not get an appointment until Friday so her doctor said to come to the ER. Denies trauma or recent new activities. Pt has not been vaccinated for COVID. Denies fever, night sweats, weight loss, congestion, reproducible pain, jaw pain, arm pain, cough, sneezing, diarrhea, vomiting, or abdominal pain.  ? ? ?  ? ?Home Medications ?Prior to Admission medications   ?Medication Sig Start Date End Date Taking? Authorizing Provider  ?acetaminophen (TYLENOL) 325 MG tablet Take 650 mg by mouth every 6 (six) hours as needed for headache.     [provider]  ?calcium carbonate (TUMS - DOSED IN MG ELEMENTAL CALCIUM) 500 MG chewable tablet Chew 2-4 tablets by mouth daily as needed for indigestion or heartburn.     [provider]  ?Cholecalciferol (D3-50) 1.25 MG (50000 UT) capsule Take by mouth. 07/18/18   [provider]  ?famotidine (PEPCID) 20 MG tablet Take 20 mg by mouth at bedtime.    [provider]  ?hydrOXYzine (VISTARIL) 50 MG capsule TAKE 1 CAPSULE BY MOUTH EVERY 6 HOURS AS NEEDED 11/03/18   [provider]  ?ibuprofen (ADVIL,MOTRIN) 600 MG tablet Take 1 tablet (600 mg total) every 6 (six) hours by mouth. 06/15/17   Clemmons, Elmore Guise, CNM  ?LATUDA 20 MG TABS tablet TAKE 1 2 (ONE HALF) TABLET BY MOUTH ONCE DAILY FOR 4 DAYS AND 1 TABLETS ONCE DAILY FOR 4 DAYS AND 2 TABLETS ONCE DAILY TAKE WITH 350 CAL. ME 11/03/18   [provider]  ?LATUDA 40 MG TABS tablet 12/03/18 not started yet 11/03/18   [provider]  ?Melatonin (MELATONIN MAXIMUM STRENGTH) 5 MG TABS Take by mouth as needed.    [provider]  ?metFORMIN (GLUCOPHAGE) 500 MG tablet Take 500 mg by mouth daily. 02/03/18   [provider]  ?Omega-3 Fatty Acids (FISH OIL PO) Take by mouth.    [provider]  ?pantoprazole (PROTONIX) 20 MG tablet Take 1 tablet (20 mg total) by mouth daily for 7 days. 03/09/19 03/16/19  Couture, Cortni S, PA-C  ?Prenatal MV-Min-FA-Omega-3 (PRENATAL GUMMIES/DHA & FA PO) Take 2 tablets by mouth daily.    [provider]  ?sucralfate (CARAFATE) 1 g tablet Take 1 tablet (1 g total) by mouth 4 (four) times daily -  with meals and at bedtime for 7 days. 03/09/19 03/16/19  Couture, Cortni S, PA-C  ?valACYclovir (VALTREX) 1000 MG tablet Take 1 tablet (1,000 mg total) by mouth 3 (three) times daily. 09/18/19   Arby Barrette, MD  ?zaleplon (SONATA) 10 MG capsule TAKE 1 CAPSULE BY MOUTH EVERY DAY AT BEDTIME AS NEEDED DO NOT TAKE WITHIN 30 MINUTES OF EATING 01/08/19   [provider]  ?   ? ?  Allergies    ?Amoxicillin, Effexor [venlafaxine], Erythromycin, Oxycontin [oxycodone hcl], Vicodin [hydrocodone-acetaminophen], Bupropion, and Mold extract [trichophyton]   ? ?Review of Systems   ?Review of Systems ?Negative except as per HPI ?Physical Exam ?Updated Vital Signs ?BP (!) 99/54   Pulse 73   Temp 98.9 ?F (37.2 ?C) (Oral)   Resp 19   Ht 5\' 4"  (1.626 m)   Wt 100.7 kg   LMP 10/21/2021 (Exact Date)   SpO2 100%   BMI 38.11 kg/m?  ?Physical Exam ?Vitals and nursing note reviewed.  ?Constitutional:   ?   General: She is not in acute distress. ?   Appearance: She is well-developed. She is not diaphoretic.  ?HENT:  ?    Head: Normocephalic and atraumatic.  ?Cardiovascular:  ?   Rate and Rhythm: Normal rate and regular rhythm.  ?   Heart sounds: Normal heart sounds. No murmur heard. ?Pulmonary:  ?   Effort: Pulmonary effort is normal.  ?   Breath sounds: Normal breath sounds. No decreased breath sounds.  ?Chest:  ?   Chest wall: No tenderness.  ?Abdominal:  ?   Palpations: Abdomen is soft.  ?   Tenderness: There is no abdominal tenderness.  ?Musculoskeletal:     ?   General: Normal range of motion.  ?   Cervical back: Neck supple.  ?   Right lower leg: No tenderness. No edema.  ?   Left lower leg: No tenderness. No edema.  ?Skin: ?   General: Skin is warm and dry.  ?   Findings: No erythema or rash.  ?Neurological:  ?   Mental Status: She is alert and oriented to person, place, and time.  ?Psychiatric:     ?   Behavior: Behavior normal.  ? ? ?ED Results / Procedures / Treatments   ?Labs ?(all labs ordered are listed, but only abnormal results are displayed) ?Labs Reviewed  ?BASIC METABOLIC PANEL - Abnormal; Notable for the following components:  ?    Result Value  ? Glucose, Bld 106 (*)   ? All other components within normal limits  ?CBC  ?PREGNANCY, URINE  ?D-DIMER, QUANTITATIVE  ?TROPONIN I (HIGH SENSITIVITY)  ? ? ?EKG ?Sinus tachycardia rate 108 ? ?Radiology ?DG Chest 2 View ? ?Result Date: 11/27/2021 ?CLINICAL DATA:  Chest pain for 4 days. EXAM: CHEST - 2 VIEW COMPARISON:  05/30/2016 FINDINGS: Heart size and mediastinal contours are unremarkable. There is no pleural effusion or edema identified. No airspace densities. IMPRESSION: No active cardiopulmonary disease. Electronically Signed   By: Signa Kellaylor  Stroud M.D.   On: 11/27/2021 09:26   ? ?Procedures ?Procedures  ? ? ?Medications Ordered in ED ?Medications  ?sodium chloride 0.9 % bolus 1,000 mL (0 mLs Intravenous Stopped 11/27/21 1202)  ? ? ?ED Course/ Medical Decision Making/ A&P ?  ?                        ?Medical Decision Making ?Amount and/or Complexity of Data Reviewed ?Labs:  ordered. ?Radiology: ordered. ? ? ?This patient presents to the ED for concern of pain with shortness of breath, palpitations, this involves an extensive number of treatment options, and is a complaint that carries with it a high risk of complications and morbidity.  The differential diagnosis includes arrhythmia, tachycardia, PE, ACS, electrolyte abnormality ? ? ?Co morbidities that complicate the patient evaluation ? ?Tachycardia, hyperlipidemia, panic attack, GERD ? ? ?Additional history obtained: ? ?External records from outside source obtained and  reviewed including prior EKG from 03/09/2019 showing sinus tachycardia with a rate of 139 ? ? ?Lab Tests: ? ?I Ordered, and personally interpreted labs.  The pertinent results include: CBC, BMP, troponin and D-dimer without significant findings ? ? ?Imaging Studies ordered: ? ?I ordered imaging studies including chest x-ray ?I independently visualized and interpreted imaging which showed no acute abnormality ?I agree with the radiologist interpretation ? ? ?Cardiac Monitoring: / EKG: ? ?The patient was maintained on a cardiac monitor.  I personally viewed and interpreted the cardiac monitored which showed an underlying rhythm of: Normal sinus rhythm with a rate in the 70s ? ?Problem List / ED Course / Critical interventions / Medication management ? ?31 year old female presents with chest discomfort with shortness of breath for the past 4 days.  Patient was taking her beta-blocker at home to manage however did not take her metoprolol today, arrives in the ED tachycardic.  This improves with rest and IV fluids.  Work-up is reassuring including negative troponin, negative D-dimer, chest x-ray normal, no significant electrolyte derangement.  Patient reports feeling well while resting in bed.  Plan is for follow-up with PCP on Friday as currently scheduled with return to ER precautions. ?I ordered medication including IV fluids for tachycardia ?Reevaluation of the patient  after these medicines showed that the patient resolved ?I have reviewed the patients home medicines and have made adjustments as needed ? ? ?Social Determinants of Health: ? ?Has PCP with plan for follow-up on

## 2021-11-27 NOTE — ED Triage Notes (Signed)
Central chest pain x 4 days , progressively getting worse , shortness of breath , worse with motion . Hx sinus tachycardia and on Metoprolol .  ?Denies cold symptoms or coughing  ?

## 2021-11-29 DIAGNOSIS — F411 Generalized anxiety disorder: Secondary | ICD-10-CM | POA: Diagnosis not present

## 2021-11-29 DIAGNOSIS — R079 Chest pain, unspecified: Secondary | ICD-10-CM | POA: Diagnosis not present

## 2021-12-01 DIAGNOSIS — R002 Palpitations: Secondary | ICD-10-CM | POA: Diagnosis not present

## 2021-12-01 DIAGNOSIS — R000 Tachycardia, unspecified: Secondary | ICD-10-CM | POA: Diagnosis not present

## 2022-01-17 DIAGNOSIS — M25512 Pain in left shoulder: Secondary | ICD-10-CM | POA: Diagnosis not present

## 2022-01-19 DIAGNOSIS — M2569 Stiffness of other specified joint, not elsewhere classified: Secondary | ICD-10-CM | POA: Diagnosis not present

## 2022-01-19 DIAGNOSIS — M799 Soft tissue disorder, unspecified: Secondary | ICD-10-CM | POA: Diagnosis not present

## 2022-01-19 DIAGNOSIS — M25512 Pain in left shoulder: Secondary | ICD-10-CM | POA: Diagnosis not present

## 2022-01-19 DIAGNOSIS — M6281 Muscle weakness (generalized): Secondary | ICD-10-CM | POA: Diagnosis not present

## 2022-01-25 DIAGNOSIS — M25512 Pain in left shoulder: Secondary | ICD-10-CM | POA: Diagnosis not present

## 2022-01-25 DIAGNOSIS — M799 Soft tissue disorder, unspecified: Secondary | ICD-10-CM | POA: Diagnosis not present

## 2022-01-25 DIAGNOSIS — M6281 Muscle weakness (generalized): Secondary | ICD-10-CM | POA: Diagnosis not present

## 2022-01-25 DIAGNOSIS — M2569 Stiffness of other specified joint, not elsewhere classified: Secondary | ICD-10-CM | POA: Diagnosis not present

## 2022-02-05 DIAGNOSIS — F4321 Adjustment disorder with depressed mood: Secondary | ICD-10-CM | POA: Diagnosis not present

## 2022-02-14 DIAGNOSIS — J039 Acute tonsillitis, unspecified: Secondary | ICD-10-CM | POA: Diagnosis not present

## 2022-02-16 DIAGNOSIS — J069 Acute upper respiratory infection, unspecified: Secondary | ICD-10-CM | POA: Diagnosis not present

## 2022-02-16 DIAGNOSIS — R112 Nausea with vomiting, unspecified: Secondary | ICD-10-CM | POA: Diagnosis not present

## 2022-02-20 DIAGNOSIS — Z01419 Encounter for gynecological examination (general) (routine) without abnormal findings: Secondary | ICD-10-CM | POA: Diagnosis not present

## 2022-02-28 DIAGNOSIS — M25512 Pain in left shoulder: Secondary | ICD-10-CM | POA: Diagnosis not present

## 2022-03-05 DIAGNOSIS — R002 Palpitations: Secondary | ICD-10-CM | POA: Diagnosis not present

## 2022-03-05 DIAGNOSIS — R000 Tachycardia, unspecified: Secondary | ICD-10-CM | POA: Diagnosis not present

## 2022-03-28 DIAGNOSIS — D2239 Melanocytic nevi of other parts of face: Secondary | ICD-10-CM | POA: Diagnosis not present

## 2022-03-28 DIAGNOSIS — D485 Neoplasm of uncertain behavior of skin: Secondary | ICD-10-CM | POA: Diagnosis not present

## 2022-06-25 DIAGNOSIS — Z30431 Encounter for routine checking of intrauterine contraceptive device: Secondary | ICD-10-CM | POA: Diagnosis not present

## 2022-06-25 DIAGNOSIS — Z3009 Encounter for other general counseling and advice on contraception: Secondary | ICD-10-CM | POA: Diagnosis not present

## 2022-07-11 DIAGNOSIS — D229 Melanocytic nevi, unspecified: Secondary | ICD-10-CM | POA: Diagnosis not present

## 2022-07-11 DIAGNOSIS — E669 Obesity, unspecified: Secondary | ICD-10-CM | POA: Diagnosis not present

## 2022-07-11 DIAGNOSIS — D225 Melanocytic nevi of trunk: Secondary | ICD-10-CM | POA: Diagnosis not present

## 2022-07-11 DIAGNOSIS — D2239 Melanocytic nevi of other parts of face: Secondary | ICD-10-CM | POA: Diagnosis not present

## 2022-07-23 DIAGNOSIS — D2239 Melanocytic nevi of other parts of face: Secondary | ICD-10-CM | POA: Diagnosis not present

## 2022-07-23 DIAGNOSIS — D485 Neoplasm of uncertain behavior of skin: Secondary | ICD-10-CM | POA: Diagnosis not present

## 2022-08-08 DIAGNOSIS — Z01818 Encounter for other preprocedural examination: Secondary | ICD-10-CM | POA: Diagnosis not present

## 2022-08-08 DIAGNOSIS — Z3009 Encounter for other general counseling and advice on contraception: Secondary | ICD-10-CM | POA: Diagnosis not present

## 2022-08-08 NOTE — H&P (Incomplete)
Cheryl [name] is an 32 y.o. [id number] who is admitted for ***.  Patient Active Problem List   Diagnosis Date Noted   Indication for care in labor or delivery 06/13/2017   Vaginal delivery 01/31/2014   Small for gestational age fetus affecting mother, antepartum 01/29/2014    Pertinent Gynecological History: Menses: {menses:16152} Bleeding: {uterine bleeding:32112} Contraception: {contraception:5051} Sexually transmitted diseases: {std risk:32110} Previous GYN Procedures: {previous procedures:3041388}  Last mammogram: {normal/abnormal***:32111} Date: *** Last pap: {normal/abnormal***:32111} Date: *** OB History: G2P2002   MEDICAL/FAMILY/SOCIAL HX: No LMP recorded.    Past Medical History:  Diagnosis Date   Anxiety    Chiari malformation type I ([place])    Gallbladder attack    GERD (gastroesophageal reflux disease)    Hyperlipidemia    Insomnia    Liver cyst    Medical history non-contributory    Panic attack    PCOS (polycystic ovarian syndrome)     Past Surgical History:  Procedure Laterality Date   TONSILLECTOMY  2015    Family History  Problem Relation Age of Onset   Diabetes Maternal Grandmother    Cancer Maternal Grandmother    Hyperlipidemia Maternal Grandmother    Hyperlipidemia Paternal Grandmother    Diabetes Paternal Grandmother    Parkinson's disease Paternal Grandmother    Diabetes Paternal Grandfather    Hyperlipidemia Paternal Grandfather    Hypertension Mother    Depression Mother    Parkinson's disease Mother    Thyroid disease Mother    Diabetes Father    Hypertension Father    Birth defects Sister        heart defect brought on by labor    Social History:  reports that she quit smoking about 11 years ago. Her smoking use included cigarettes. She has never used smokeless tobacco. She reports that she does not currently use alcohol. She reports that she does not use drugs.  ALLERGIES/MEDS:  Allergies:  Allergies  Allergen Reactions    Amoxicillin Nausea And Vomiting   Effexor [Venlafaxine] Hives   Erythromycin Itching    swelling   Oxycontin [Oxycodone Hcl] Itching   Vicodin [Hydrocodone-Acetaminophen] Itching   Bupropion Anxiety   Mold Extract [Trichophyton] Rash    No medications prior to admission.     ROS  There were no vitals taken for this visit. Physical Exam  No results found for this or any previous visit (from the past 24 hour(s)).  No results found.   ASSESSMENT/PLAN: Cheryl [name] is a 32 y.o. [id number] who is admitted for ***   Drema Dallas, DO

## 2022-10-11 DIAGNOSIS — Z30431 Encounter for routine checking of intrauterine contraceptive device: Secondary | ICD-10-CM | POA: Diagnosis not present

## 2022-11-08 NOTE — H&P (Signed)
Cheryl [name] is an 32 y.o. [id number] who is admitted for Laparoscopic bilateral salpingectomy for desire for permanent sterilization.  Patient currently has a Skyla IUD and would like to keep it in place until it is due for removal (inserted 02/2020). Patient's husband had a vasectomy but he unfortunately passed away. She is 100% positive that she has completed childbearing. She understands possible need for hormonal contraceptives for AUB in the future, if it were to occur.  Patient Active Problem List   Diagnosis Date Noted   Indication for care in labor or delivery 06/13/2017   Vaginal delivery 01/31/2014   Small for gestational age fetus affecting mother, antepartum 01/29/2014    MEDICAL/FAMILY/SOCIAL HX: Patient's last menstrual period was 10/31/2022.    Past Medical History:  Diagnosis Date   Anxiety    Chiari malformation type I    Gallbladder attack    GERD (gastroesophageal reflux disease)    Hyperlipidemia    Insomnia    Liver cyst    Medical history non-contributory    Panic attack    PCOS (polycystic ovarian syndrome)     Past Surgical History:  Procedure Laterality Date   TONSILLECTOMY  2015    Family History  Problem Relation Age of Onset   Diabetes Maternal Grandmother    Cancer Maternal Grandmother    Hyperlipidemia Maternal Grandmother    Hyperlipidemia Paternal Grandmother    Diabetes Paternal Grandmother    Parkinson's disease Paternal Grandmother    Diabetes Paternal Grandfather    Hyperlipidemia Paternal Grandfather    Hypertension Mother    Depression Mother    Parkinson's disease Mother    Thyroid disease Mother    Diabetes Father    Hypertension Father    Birth defects Sister        heart defect brought on by labor    Social History:  reports that she quit smoking about 11 years ago. Her smoking use included cigarettes. She has never used smokeless tobacco. She reports that she does not currently use alcohol. She reports that she does  not use drugs.  ALLERGIES/MEDS:  Allergies:  Allergies  Allergen Reactions   Amoxicillin Nausea And Vomiting   Effexor [Venlafaxine] Hives   Erythromycin Itching    swelling   Oxycontin [Oxycodone Hcl] Itching   Vicodin [Hydrocodone-Acetaminophen] Itching   Bupropion Anxiety   Mold Extract [Trichophyton] Rash    No medications prior to admission.     Review of Systems  Constitutional: Negative.   HENT: Negative.    Eyes: Negative.   Respiratory: Negative.    Cardiovascular: Negative.   Gastrointestinal: Negative.   Genitourinary: Negative.   Musculoskeletal: Negative.   Skin: Negative.   Neurological: Negative.   Endo/Heme/Allergies: Negative.   Psychiatric/Behavioral: Negative.      Height  (1.626 m), weight 95.3 kg, last menstrual period 10/31/2022. Gen:  NAD, pleasant and cooperative Cardio:  RRR Pulm:  CTAB, no wheezes/rales/rhonchi Abd:  Soft, non-distended, non-tender throughout, no rebound/guarding Ext:  No bilateral LE edema, no bilateral calf tenderness  No results found for this or any previous visit (from the past 24 hour(s)).  No results found.   ASSESSMENT/PLAN: AUNDRAYA DRIPPS is a 32 y.o. [phone] who is admitted for Laparoscopic bilateral salpingectomy for desire for permanent sterilization.  - Admit to [facility] - Admit labs (CBC, T&S, urine HCG) - Diet:  Per anesthesia/ERAS  - IVF:  Per anesthesia - VTE Prophylaxis:  SCDs - Antibiotics: None - D/C home same-day  Consents: I  have discussed with the patient that this surgery is performed to provide permanent female sterilization by removing the entirety of each fallopian tube.  Prior to surgery, the risks and benefits of the surgery, as well as alternative treatments, have been discussed.  The risks of proceeding with this surgery include, but are not limited to, bleeding, including the need for a blood transfusion, infection, damage to surrounding organs and tissues, damage to bladder,  damage to ureters, causing kidney damage, and requiring additional procedures, damage to bowels, resulting in further surgery, postoperative pain, short-term and long-term, scarring on the abdominal wall and intra-abdominally, need for further surgery, development of an incisional hernia, need for an open procedure, deep vein thrombosis and/or pulmonary embolism, wound infection and/or separation, painful intercourse, failure of the procedure to prevent pregnancy, increased risk of ectopic pregnancy requiring surgery if procedure failure occurs complications the course of which cannot be predicted or prevented, and death. Patient understands risk of need for hormonal contraceptives for AUB in the future. Patient was consented for blood products.  The patient is aware that bleeding may result in the need for a blood transfusion which includes risk of transmission of HIV (1:2 million), Hepatitis C (1:2 million), and Hepatitis B (1:200 thousand) and transfusion reaction.  Patient voiced understanding of the above risks as well as understanding of indications for blood transfusion.  Cheryl Ready, DO

## 2022-11-13 ENCOUNTER — Encounter (HOSPITAL_COMMUNITY): Payer: Self-pay | Admitting: *Deleted

## 2022-11-13 NOTE — Progress Notes (Signed)
Spoke w/ via phone for pre-op interview--- Cheryl [name] needs dos----   CBC T&S and UPT per surgeon           [name] results------ COVID test -----patient states asymptomatic no test needed Arrive at -------1045 NPO after MN NO Solid Food.  Clear liquids from MN until---0945 Med rec completed Medications to take morning of surgery -----NONE Diabetic medication ----- Patient instructed no nail polish to be worn day of surgery Patient instructed to bring photo id and insurance card day of surgery Patient aware to have Driver (ride ) / caregiver  Sister Cheryl [name]  for 24 hours after surgery  Patient Special Instructions ----- Pre-Op special Istructions ----- Patient verbalized understanding of instructions that were given at this phone interview. Patient denies shortness of breath, chest pain, fever, cough at this phone interview.

## 2022-11-16 DIAGNOSIS — Z302 Encounter for sterilization: Secondary | ICD-10-CM | POA: Diagnosis not present

## 2022-11-16 DIAGNOSIS — Z01818 Encounter for other preprocedural examination: Secondary | ICD-10-CM | POA: Diagnosis not present

## 2022-11-19 DIAGNOSIS — R051 Acute cough: Secondary | ICD-10-CM | POA: Diagnosis not present

## 2022-11-19 DIAGNOSIS — J209 Acute bronchitis, unspecified: Secondary | ICD-10-CM | POA: Diagnosis not present

## 2022-11-21 DIAGNOSIS — Z302 Encounter for sterilization: Secondary | ICD-10-CM

## 2022-12-17 NOTE — Progress Notes (Signed)
Called pt to do pre-op interview for surgery on 12-26-2022 @ [facility] by Dr Judie Petit. Cheryl [name].  Pt stated she spoke w/ office on Friday 12-14-2022 and procedure was cancelled due to her child having kindergarten graduation.

## 2022-12-26 ENCOUNTER — Ambulatory Visit (HOSPITAL_BASED_OUTPATIENT_CLINIC_OR_DEPARTMENT_OTHER): Admission: RE | Admit: 2022-12-26 | Payer: 59 | Source: Home / Self Care | Admitting: Obstetrics and Gynecology

## 2022-12-26 ENCOUNTER — Other Ambulatory Visit: Payer: Self-pay

## 2022-12-26 DIAGNOSIS — Z302 Encounter for sterilization: Secondary | ICD-10-CM

## 2022-12-26 SURGERY — SALPINGECTOMY, BILATERAL, LAPAROSCOPIC
Anesthesia: General | Laterality: Bilateral

## 2023-03-20 DIAGNOSIS — Z30433 Encounter for removal and reinsertion of intrauterine contraceptive device: Secondary | ICD-10-CM | POA: Diagnosis not present

## 2023-03-20 DIAGNOSIS — Z3202 Encounter for pregnancy test, result negative: Secondary | ICD-10-CM | POA: Diagnosis not present

## 2023-04-25 DIAGNOSIS — Z01419 Encounter for gynecological examination (general) (routine) without abnormal findings: Secondary | ICD-10-CM | POA: Diagnosis not present

## 2023-04-25 DIAGNOSIS — R87610 Atypical squamous cells of undetermined significance on cytologic smear of cervix (ASC-US): Secondary | ICD-10-CM | POA: Diagnosis not present

## 2023-04-25 DIAGNOSIS — Z1151 Encounter for screening for human papillomavirus (HPV): Secondary | ICD-10-CM | POA: Diagnosis not present

## 2023-04-25 DIAGNOSIS — Z124 Encounter for screening for malignant neoplasm of cervix: Secondary | ICD-10-CM | POA: Diagnosis not present

## 2023-04-25 DIAGNOSIS — Z30431 Encounter for routine checking of intrauterine contraceptive device: Secondary | ICD-10-CM | POA: Diagnosis not present

## 2023-05-30 DIAGNOSIS — R000 Tachycardia, unspecified: Secondary | ICD-10-CM | POA: Diagnosis not present

## 2023-05-30 DIAGNOSIS — R002 Palpitations: Secondary | ICD-10-CM | POA: Diagnosis not present

## 2023-06-03 DIAGNOSIS — L820 Inflamed seborrheic keratosis: Secondary | ICD-10-CM | POA: Diagnosis not present

## 2023-06-06 DIAGNOSIS — E785 Hyperlipidemia, unspecified: Secondary | ICD-10-CM | POA: Diagnosis not present

## 2023-06-06 DIAGNOSIS — R5383 Other fatigue: Secondary | ICD-10-CM | POA: Diagnosis not present

## 2023-06-16 ENCOUNTER — Other Ambulatory Visit: Payer: Self-pay

## 2023-06-16 ENCOUNTER — Emergency Department (HOSPITAL_BASED_OUTPATIENT_CLINIC_OR_DEPARTMENT_OTHER): Payer: 59

## 2023-06-16 ENCOUNTER — Emergency Department (HOSPITAL_BASED_OUTPATIENT_CLINIC_OR_DEPARTMENT_OTHER)
Admission: EM | Admit: 2023-06-16 | Discharge: 2023-06-16 | Disposition: A | Discharge status: Home / Self Care | Payer: 59 | Attending: Emergency Medicine | Admitting: Emergency Medicine

## 2023-06-16 ENCOUNTER — Encounter (HOSPITAL_BASED_OUTPATIENT_CLINIC_OR_DEPARTMENT_OTHER): Payer: Self-pay | Admitting: Emergency Medicine

## 2023-06-16 DIAGNOSIS — R1011 Right upper quadrant pain: Secondary | ICD-10-CM | POA: Diagnosis not present

## 2023-06-16 DIAGNOSIS — R112 Nausea with vomiting, unspecified: Secondary | ICD-10-CM | POA: Diagnosis not present

## 2023-06-16 DIAGNOSIS — R939 Diagnostic imaging inconclusive due to excess body fat of patient: Secondary | ICD-10-CM | POA: Diagnosis not present

## 2023-06-16 DIAGNOSIS — Z87891 Personal history of nicotine dependence: Secondary | ICD-10-CM | POA: Diagnosis not present

## 2023-06-16 DIAGNOSIS — K7689 Other specified diseases of liver: Secondary | ICD-10-CM | POA: Diagnosis not present

## 2023-06-16 LAB — COMPREHENSIVE METABOLIC PANEL
ALT: 18 U/L (ref 0–44)
AST: 15 U/L (ref 15–41)
Albumin: 4.3 g/dL (ref 3.5–5.0)
Alkaline Phosphatase: 51 U/L (ref 38–126)
Anion gap: 8 (ref 5–15)
BUN: 7 mg/dL (ref 6–20)
CO2: 24 mmol/L (ref 22–32)
Calcium: 9 mg/dL (ref 8.9–10.3)
Chloride: 104 mmol/L (ref 98–111)
Creatinine, Ser: 0.7 mg/dL (ref 0.44–1.00)
GFR, Estimated: >60 mL/min (ref >60)
Glucose, Bld: 91 mg/dL (ref 70–99)
Potassium: 3.8 mmol/L (ref 3.5–5.1)
Sodium: 136 mmol/L (ref 135–145)
Total Bilirubin: 2.1 mg/dL — ABNORMAL HIGH (ref <1.2)
Total Protein: 7 g/dL (ref 6.5–8.1)

## 2023-06-16 LAB — CBC WITH DIFFERENTIAL/PLATELET
Abs Immature Granulocytes: 0.02 10*3/uL (ref 0.00–0.07)
Basophils Absolute: 0 10*3/uL (ref 0.0–0.1)
Basophils Relative: 0 %
Eosinophils Absolute: 0 10*3/uL (ref 0.0–0.5)
Eosinophils Relative: 1 %
HCT: 40.2 % (ref 36.0–46.0)
Hemoglobin: 13.7 g/dL (ref 12.0–15.0)
Immature Granulocytes: 0 %
Lymphocytes Relative: 30 %
Lymphs Abs: 1.7 10*3/uL (ref 0.7–4.0)
MCH: 29.1 pg (ref 26.0–34.0)
MCHC: 34.1 g/dL (ref 30.0–36.0)
MCV: 85.5 fL (ref 80.0–100.0)
Monocytes Absolute: 0.4 10*3/uL (ref 0.1–1.0)
Monocytes Relative: 7 %
Neutro Abs: 3.6 10*3/uL (ref 1.7–7.7)
Neutrophils Relative %: 62 %
Platelets: 280 10*3/uL (ref 150–400)
RBC: 4.7 MIL/uL (ref 3.87–5.11)
RDW: 12.1 % (ref 11.5–15.5)
WBC: 5.7 10*3/uL (ref 4.0–10.5)
nRBC: 0 % (ref 0.0–0.2)

## 2023-06-16 LAB — LIPASE, BLOOD: Lipase: 38 U/L (ref 11–51)

## 2023-06-16 LAB — PREGNANCY, URINE: Preg Test, Ur: NEGATIVE

## 2023-06-16 MED ORDER — ONDANSETRON HCL 4 MG/2ML IJ SOLN
4.0000 mg | Freq: Once | INTRAMUSCULAR | Status: AC
  Administered 2023-06-16: 4 mg via INTRAVENOUS
  Filled 2023-06-16: qty 2

## 2023-06-16 NOTE — Discharge Instructions (Signed)
Bilirubin is elevated.  But workup for gallstones was negative.  Bilirubin today was 2.1.  Would recommend you follow-up with your primary care doctor.  If symptoms persist follow-up with gastroenterology and or consideration for HIDA scan to rule out dysfunctional gallbladder.  Return for any new or worse symptoms.

## 2023-06-16 NOTE — ED Notes (Signed)
Discharge instructions reviewed with patient. Patient questions answered and opportunity for education reviewed. Patient voices understanding of discharge instructions with no further questions. Patient ambulatory with steady gait to lobby.  

## 2023-06-16 NOTE — ED Provider Notes (Addendum)
[facility] Provider Note   CSN: [id number] Arrival date & time: 06/16/23  1232     History  Chief Complaint  Patient presents with   Abdominal Pain    Cheryl [name] is a 32 y.o. female.  Patient presenting with a complaint of right flank pain right upper quadrant pain for about a week.  But is been worse the past few days definitely worse starting yesterday.  Associated with nausea vomiting.  No vomiting today.  Patient states that her primary care doctor told her that her bilirubin was elevated.  She has not had any pain though prior to this 1 week.  No fevers.  No dysuria.  No blood in the urine.  Temp is 97.5 blood pressure 125/80 pulse 92 oxygen saturations at 100%.  Past medical history does not list gallbladder attack patient does not seem to respond to be having known gallstones.  History of gastroesophageal reflux disease, artery malformation hyperlipidemia.  Patient is a former smoker quit in 2012.       Home Medications Prior to Admission medications   Medication Sig Start Date End Date Taking? Authorizing Provider  acetaminophen (TYLENOL) 325 MG tablet Take 650 mg by mouth every 6 (six) hours as needed for headache.     [provider]  calcium carbonate (TUMS - DOSED IN MG ELEMENTAL CALCIUM) 500 MG chewable tablet Chew 2-4 tablets by mouth daily as needed for indigestion or heartburn.     [provider]  Cholecalciferol (D3-50) 1.25 MG (50000 UT) capsule Take by mouth. 07/18/18   [provider]  ibuprofen (ADVIL,MOTRIN) 600 MG tablet Take 1 tablet (600 mg total) every 6 (six) hours by mouth. 06/15/17   Clemmons, Lori A, CNM  magnesium gluconate (MAGONATE) 500 MG tablet Take 500 mg by mouth 2 (two) times daily.    [provider]  Melatonin (MELATONIN MAXIMUM STRENGTH) 5 MG TABS Take by mouth as needed.    [provider]  sucralfate (CARAFATE) 1 g tablet Take 1 tablet (1 g  total) by mouth 4 (four) times daily -  with meals and at bedtime for 7 days. 03/09/19 03/16/19  Couture, Cortni S, PA-C      Allergies    Amoxicillin, Effexor [venlafaxine], Erythromycin, Hydrocodone, Oxycontin [oxycodone hcl], and Bupropion    Review of Systems   Review of Systems  Constitutional:  Negative for chills and fever.  HENT:  Negative for ear pain and sore throat.   Eyes:  Negative for pain and visual disturbance.  Respiratory:  Negative for cough and shortness of breath.   Cardiovascular:  Negative for chest pain and palpitations.  Gastrointestinal:  Positive for abdominal pain, nausea and vomiting.  Genitourinary:  Positive for flank pain. Negative for dysuria and hematuria.  Musculoskeletal:  Negative for arthralgias and back pain.  Skin:  Negative for color change and rash.  Neurological:  Negative for seizures and syncope.  All other systems reviewed and are negative.   Physical Exam Updated Vital Signs BP 125/80 (BP Location: Right Arm)   Pulse 92   Temp (!) 97.5 F (36.4 C) (Oral)   Resp 16   Ht 1.626 m (5\' 4" )   Wt 96.2 kg   LMP 05/21/2023   SpO2 100%   BMI 36.39 kg/m  Physical Exam Vitals and nursing note reviewed.  Constitutional:      General: She is not in acute distress.    Appearance: Normal appearance. She is well-developed.  She is not ill-appearing.  HENT:     Head: Normocephalic and atraumatic.  Eyes:     Extraocular Movements: Extraocular movements intact.     Conjunctiva/sclera: Conjunctivae normal.     Pupils: Pupils are equal, round, and reactive to light.  Cardiovascular:     Rate and Rhythm: Normal rate and regular rhythm.     Heart sounds: No murmur heard. Pulmonary:     Effort: Pulmonary effort is normal. No respiratory distress.     Breath sounds: Normal breath sounds.  Chest:     Chest wall: No tenderness.  Abdominal:     General: There is no distension.     Palpations: Abdomen is soft.     Tenderness: There is no abdominal  tenderness. There is no guarding.     Comments: Abdomen soft nontender.  Musculoskeletal:        General: No swelling.     Cervical back: Neck supple.  Skin:    General: Skin is warm and dry.     Capillary Refill: Capillary refill takes less than 2 seconds.  Neurological:     General: No focal deficit present.     Mental Status: She is alert and oriented to person, place, and time.  Psychiatric:        Mood and Affect: Mood normal.     ED Results / Procedures / Treatments   Labs (all labs ordered are listed, but only abnormal results are displayed) Labs Reviewed  CBC WITH DIFFERENTIAL/PLATELET  PREGNANCY, URINE  COMPREHENSIVE METABOLIC PANEL  LIPASE, BLOOD    EKG None  Radiology No results found.  Procedures Procedures    Medications Ordered in ED Medications - No data to display  ED Course/ Medical Decision Making/ A&P Clinical Course as of 06/16/23 1518  Sun Jun 16, 2023  1516 Stable HO from [facility] 32YOF with RUQ pain. Seen recently with a mildly + bili. Formal US and CXR pending.  [CC]  1517 IMPRESSION: No findings to explain the patient's symptoms.   [CC]  1517 IMPRESSION: No active cardiopulmonary disease.   [CC]    Clinical Course User Index [CC] Glyn Ade, MD                                 Medical Decision Making Amount and/or Complexity of Data Reviewed Labs: ordered. Radiology: ordered.  Risk Prescription drug management.   Based on patient's complaint of right flank pain right upper quadrant pain elevated bilirubin will go ahead and proceed with abdominal ultrasound.  If negative may need to proceed to CT scan.  Will also get chest x-ray.  Reason for the chest x-ray is that she does have some discomfort kind of at the lateral rib margin.  This is to rule out pneumonia.  Patient states that she needs Zofran but does not need any pain medicine.  Patient's chest x-ray and ultrasound results are still pending.  CBC white count 5.7  hemoglobin 13.7 platelets 280.  Complete metabolic panel normal other than bilirubin being elevated to 2.1 but liver function test in particular alk phos is 51.  Very reassuring renal function normal.  Lipase also normal at 38.  Pregnancy test negative.   Ultrasound shows normal common bile duct no signs of any stones.  No other acute findings in the abdomen.  Also chest x-ray read is negative.  Offered patient CT abdomen pelvis but based on these findings it would be  fine if she does follow back up with her primary care doctor for further evaluation of the elevated bilirubin.  Final Clinical Impression(s) / ED Diagnoses Final diagnoses:  Right upper quadrant abdominal pain    Rx / DC Orders ED Discharge Orders     None         Vanetta Mulders, MD 06/16/23 1343    Vanetta Mulders, MD 06/16/23 1610    Vanetta Mulders, MD 06/16/23 [phone]

## 2023-06-16 NOTE — ED Notes (Signed)
Patient transported to X-ray 

## 2023-06-16 NOTE — ED Notes (Signed)
ED Provider at bedside. 

## 2023-06-16 NOTE — ED Triage Notes (Signed)
Pt c/o RUQ pain x 3d, worsening now; vomiting after she eats; elevated bilirubin week before last

## 2023-06-16 NOTE — ED Provider Notes (Signed)
Care of patient received from prior provider at 3:16 PM, please see their note for complete H/P and care plan.  Received handoff per ED course.  Clinical Course as of 06/16/23 1518  Sun Jun 16, 2023  1516 Stable HO from [facility] 32YOF with RUQ pain. Seen recently with a mildly + bili. Formal US and CXR pending.  [CC]  1517 IMPRESSION: No findings to explain the patient's symptoms.   [CC]  1517 IMPRESSION: No active cardiopulmonary disease.   [CC]    Clinical Course User Index [CC] Glyn Ade, MD   Disposition:  I have considered need for hospitalization, however, considering all of the above, I believe this patient is stable for discharge at this time.  Patient/family educated about specific return precautions for given chief complaint and symptoms.  Patient/family educated about follow-up with PCP.     Patient/family expressed understanding of return precautions and need for follow-up. Patient spoken to regarding all imaging and laboratory results and appropriate follow up for these results. All education provided in verbal form with additional information in written form. Time was allowed for answering of patient questions. Patient discharged.    Emergency Department Medication Summary:   Medications  ondansetron [facility]) injection 4 mg (4 mg Intravenous Given 06/16/23 1355)            Glyn Ade, MD 06/16/23 2304

## 2023-06-17 ENCOUNTER — Other Ambulatory Visit: Payer: Self-pay | Admitting: Family Medicine

## 2023-07-02 ENCOUNTER — Emergency Department (HOSPITAL_BASED_OUTPATIENT_CLINIC_OR_DEPARTMENT_OTHER): Admission: EM | Admit: 2023-07-02 | Discharge: 2023-07-02 | Discharge status: Against Medical Advice | Payer: 59

## 2023-07-02 ENCOUNTER — Other Ambulatory Visit: Payer: Self-pay

## 2023-07-02 NOTE — ED Notes (Signed)
Called for x 2 in lobby with no result

## 2023-07-02 NOTE — ED Notes (Signed)
Called for in all waiting areas with no answer, look outside for pt as well

## 2023-07-17 DIAGNOSIS — D2262 Melanocytic nevi of left upper limb, including shoulder: Secondary | ICD-10-CM | POA: Diagnosis not present

## 2023-07-17 DIAGNOSIS — L578 Other skin changes due to chronic exposure to nonionizing radiation: Secondary | ICD-10-CM | POA: Diagnosis not present

## 2023-07-17 DIAGNOSIS — L814 Other melanin hyperpigmentation: Secondary | ICD-10-CM | POA: Diagnosis not present

## 2023-07-17 DIAGNOSIS — D225 Melanocytic nevi of trunk: Secondary | ICD-10-CM | POA: Diagnosis not present

## 2023-07-17 DIAGNOSIS — D2261 Melanocytic nevi of right upper limb, including shoulder: Secondary | ICD-10-CM | POA: Diagnosis not present

## 2023-07-17 DIAGNOSIS — D2239 Melanocytic nevi of other parts of face: Secondary | ICD-10-CM | POA: Diagnosis not present

## 2023-08-29 DIAGNOSIS — E785 Hyperlipidemia, unspecified: Secondary | ICD-10-CM | POA: Diagnosis not present

## 2023-08-29 DIAGNOSIS — E559 Vitamin D deficiency, unspecified: Secondary | ICD-10-CM | POA: Diagnosis not present

## 2023-08-29 DIAGNOSIS — N390 Urinary tract infection, site not specified: Secondary | ICD-10-CM | POA: Diagnosis not present

## 2023-11-28 DIAGNOSIS — E785 Hyperlipidemia, unspecified: Secondary | ICD-10-CM | POA: Diagnosis not present

## 2024-02-04 DIAGNOSIS — F4312 Post-traumatic stress disorder, chronic: Secondary | ICD-10-CM | POA: Diagnosis not present

## 2024-02-19 DIAGNOSIS — F4312 Post-traumatic stress disorder, chronic: Secondary | ICD-10-CM | POA: Diagnosis not present

## 2024-02-27 DIAGNOSIS — F4312 Post-traumatic stress disorder, chronic: Secondary | ICD-10-CM | POA: Diagnosis not present

## 2024-03-11 DIAGNOSIS — F4312 Post-traumatic stress disorder, chronic: Secondary | ICD-10-CM | POA: Diagnosis not present

## 2024-03-17 DIAGNOSIS — N63 Unspecified lump in unspecified breast: Secondary | ICD-10-CM | POA: Diagnosis not present

## 2024-03-18 ENCOUNTER — Other Ambulatory Visit: Payer: Self-pay | Admitting: Nurse Practitioner

## 2024-03-18 DIAGNOSIS — N63 Unspecified lump in unspecified breast: Secondary | ICD-10-CM

## 2024-03-19 DIAGNOSIS — F4312 Post-traumatic stress disorder, chronic: Secondary | ICD-10-CM | POA: Diagnosis not present

## 2024-03-25 ENCOUNTER — Ambulatory Visit
Admission: RE | Admit: 2024-03-25 | Discharge: 2024-03-25 | Disposition: A | Discharge status: Home / Self Care | Source: Ambulatory Visit | Attending: Nurse Practitioner | Admitting: Nurse Practitioner

## 2024-03-25 DIAGNOSIS — R928 Other abnormal and inconclusive findings on diagnostic imaging of breast: Secondary | ICD-10-CM | POA: Diagnosis not present

## 2024-03-25 DIAGNOSIS — N63 Unspecified lump in unspecified breast: Secondary | ICD-10-CM

## 2024-04-02 DIAGNOSIS — F4312 Post-traumatic stress disorder, chronic: Secondary | ICD-10-CM | POA: Diagnosis not present

## 2024-04-09 DIAGNOSIS — F4312 Post-traumatic stress disorder, chronic: Secondary | ICD-10-CM | POA: Diagnosis not present

## 2024-04-21 DIAGNOSIS — F4312 Post-traumatic stress disorder, chronic: Secondary | ICD-10-CM | POA: Diagnosis not present

## 2024-04-28 DIAGNOSIS — Z30431 Encounter for routine checking of intrauterine contraceptive device: Secondary | ICD-10-CM | POA: Diagnosis not present

## 2024-04-28 DIAGNOSIS — R000 Tachycardia, unspecified: Secondary | ICD-10-CM | POA: Diagnosis not present

## 2024-04-28 DIAGNOSIS — Z124 Encounter for screening for malignant neoplasm of cervix: Secondary | ICD-10-CM | POA: Diagnosis not present

## 2024-04-28 DIAGNOSIS — N898 Other specified noninflammatory disorders of vagina: Secondary | ICD-10-CM | POA: Diagnosis not present

## 2024-04-28 DIAGNOSIS — Z1151 Encounter for screening for human papillomavirus (HPV): Secondary | ICD-10-CM | POA: Diagnosis not present

## 2024-04-28 DIAGNOSIS — R87610 Atypical squamous cells of undetermined significance on cytologic smear of cervix (ASC-US): Secondary | ICD-10-CM | POA: Diagnosis not present

## 2024-04-28 DIAGNOSIS — R002 Palpitations: Secondary | ICD-10-CM | POA: Diagnosis not present

## 2024-04-28 DIAGNOSIS — Z01419 Encounter for gynecological examination (general) (routine) without abnormal findings: Secondary | ICD-10-CM | POA: Diagnosis not present

## 2024-04-28 DIAGNOSIS — Z113 Encounter for screening for infections with a predominantly sexual mode of transmission: Secondary | ICD-10-CM | POA: Diagnosis not present

## 2024-04-30 DIAGNOSIS — F4312 Post-traumatic stress disorder, chronic: Secondary | ICD-10-CM | POA: Diagnosis not present

## 2024-05-04 DIAGNOSIS — R002 Palpitations: Secondary | ICD-10-CM | POA: Diagnosis not present

## 2024-05-04 DIAGNOSIS — R000 Tachycardia, unspecified: Secondary | ICD-10-CM | POA: Diagnosis not present

## 2024-05-21 DIAGNOSIS — F4312 Post-traumatic stress disorder, chronic: Secondary | ICD-10-CM | POA: Diagnosis not present

## 2024-06-02 DIAGNOSIS — F4312 Post-traumatic stress disorder, chronic: Secondary | ICD-10-CM | POA: Diagnosis not present

## 2024-06-09 DIAGNOSIS — R002 Palpitations: Secondary | ICD-10-CM | POA: Diagnosis not present

## 2024-06-11 DIAGNOSIS — F4312 Post-traumatic stress disorder, chronic: Secondary | ICD-10-CM | POA: Diagnosis not present

## 2024-06-23 DIAGNOSIS — F4312 Post-traumatic stress disorder, chronic: Secondary | ICD-10-CM | POA: Diagnosis not present

## 2024-06-30 DIAGNOSIS — F4312 Post-traumatic stress disorder, chronic: Secondary | ICD-10-CM | POA: Diagnosis not present

## 2024-07-07 DIAGNOSIS — F4312 Post-traumatic stress disorder, chronic: Secondary | ICD-10-CM | POA: Diagnosis not present

## 2024-07-09 DIAGNOSIS — R102 Pelvic and perineal pain unspecified side: Secondary | ICD-10-CM | POA: Diagnosis not present

## 2024-07-09 DIAGNOSIS — N898 Other specified noninflammatory disorders of vagina: Secondary | ICD-10-CM | POA: Diagnosis not present

## 2024-07-22 ENCOUNTER — Other Ambulatory Visit: Payer: Self-pay | Admitting: Nurse Practitioner

## 2024-07-22 ENCOUNTER — Encounter: Payer: Self-pay | Admitting: Nurse Practitioner

## 2024-07-22 DIAGNOSIS — R102 Pelvic and perineal pain unspecified side: Secondary | ICD-10-CM

## 2024-07-22 DIAGNOSIS — N838 Other noninflammatory disorders of ovary, fallopian tube and broad ligament: Secondary | ICD-10-CM

## 2024-07-23 ENCOUNTER — Other Ambulatory Visit

## 2024-07-27 DIAGNOSIS — F4312 Post-traumatic stress disorder, chronic: Secondary | ICD-10-CM | POA: Diagnosis not present

## 2024-08-04 ENCOUNTER — Encounter: Payer: Self-pay | Admitting: Nurse Practitioner

## 2024-08-10 ENCOUNTER — Other Ambulatory Visit

## 2024-08-10 ENCOUNTER — Inpatient Hospital Stay: Admission: RE | Admit: 2024-08-10

## 2024-08-10 DIAGNOSIS — R102 Pelvic and perineal pain unspecified side: Secondary | ICD-10-CM

## 2024-08-10 DIAGNOSIS — N838 Other noninflammatory disorders of ovary, fallopian tube and broad ligament: Secondary | ICD-10-CM

## 2024-08-10 MED ORDER — GADOPICLENOL 0.5 MMOL/ML IV SOLN
10.0000 mL | Freq: Once | INTRAVENOUS | Status: AC | PRN
  Administered 2024-08-10: 10 mL via INTRAVENOUS
